# Patient Record
Sex: Female | Born: 1975 | Race: Black or African American | Hispanic: No | Marital: Married | Smoking: Never smoker
Health system: Southern US, Community
[De-identification: ages and names within clinical notes are randomized; demographics above are authoritative.]

## PROBLEM LIST (undated history)

## (undated) DIAGNOSIS — E079 Disorder of thyroid, unspecified: Secondary | ICD-10-CM

## (undated) DIAGNOSIS — I10 Essential (primary) hypertension: Secondary | ICD-10-CM

## (undated) HISTORY — PX: BREAST CYST EXCISION: SHX579

---

## 2008-08-20 ENCOUNTER — Emergency Department (HOSPITAL_COMMUNITY): Admission: EM | Admit: 2008-08-20 | Discharge: 2008-08-20 | Payer: Self-pay | Admitting: Emergency Medicine

## 2008-10-05 ENCOUNTER — Emergency Department (HOSPITAL_COMMUNITY): Admission: EM | Admit: 2008-10-05 | Discharge: 2008-10-05 | Payer: Self-pay | Admitting: Emergency Medicine

## 2008-11-09 ENCOUNTER — Encounter: Payer: Self-pay | Admitting: Obstetrics & Gynecology

## 2008-11-09 ENCOUNTER — Ambulatory Visit: Payer: Self-pay | Admitting: Obstetrics and Gynecology

## 2008-11-09 LAB — CONVERTED CEMR LAB
Chlamydia, DNA Probe: NEGATIVE
GC Probe Amp, Genital: NEGATIVE

## 2008-11-13 ENCOUNTER — Ambulatory Visit (HOSPITAL_COMMUNITY): Admission: RE | Admit: 2008-11-13 | Discharge: 2008-11-13 | Payer: Self-pay | Admitting: Obstetrics & Gynecology

## 2008-11-24 ENCOUNTER — Other Ambulatory Visit: Admission: RE | Admit: 2008-11-24 | Discharge: 2008-11-24 | Payer: Self-pay | Admitting: Obstetrics and Gynecology

## 2008-11-24 ENCOUNTER — Ambulatory Visit: Payer: Self-pay | Admitting: Family Medicine

## 2008-11-24 LAB — CONVERTED CEMR LAB
Ferritin: 16 ng/mL (ref 10–291)
Hgb A2 Quant: 2.1 % — ABNORMAL LOW (ref 2.2–3.2)
Hgb S Quant: 0 % (ref 0.0–0.0)
Vitamin B-12: 487 pg/mL (ref 211–911)

## 2008-12-13 ENCOUNTER — Ambulatory Visit: Payer: Self-pay | Admitting: Obstetrics and Gynecology

## 2008-12-13 LAB — CONVERTED CEMR LAB
Free T4: 0.53 ng/dL — ABNORMAL LOW (ref 0.80–1.80)
T3, Free: 2.3 pg/mL (ref 2.3–4.2)

## 2009-10-10 ENCOUNTER — Encounter (INDEPENDENT_AMBULATORY_CARE_PROVIDER_SITE_OTHER): Payer: Self-pay | Admitting: Family Medicine

## 2009-10-10 ENCOUNTER — Ambulatory Visit: Payer: Self-pay | Admitting: Internal Medicine

## 2009-10-10 LAB — CONVERTED CEMR LAB
ALT: <8 units/L (ref 0–35)
Basophils Relative: 1 % (ref 0–1)
Glucose, Bld: 87 mg/dL (ref 70–99)
Lymphs Abs: 1.2 10*3/uL (ref 0.7–4.0)
MCV: 74 fL — ABNORMAL LOW (ref 78.0–100.0)
Monocytes Absolute: 0.4 10*3/uL (ref 0.1–1.0)
Platelets: 452 10*3/uL — ABNORMAL HIGH (ref 150–400)
RDW: 17.3 % — ABNORMAL HIGH (ref 11.5–15.5)
Total Bilirubin: 0.4 mg/dL (ref 0.3–1.2)
Vit D, 25-Hydroxy: <10 ng/mL — ABNORMAL LOW (ref 30–89)
WBC: 4.5 10*3/uL (ref 4.0–10.5)

## 2009-11-07 ENCOUNTER — Encounter (INDEPENDENT_AMBULATORY_CARE_PROVIDER_SITE_OTHER): Payer: Self-pay | Admitting: Family Medicine

## 2009-11-07 ENCOUNTER — Ambulatory Visit: Payer: Self-pay | Admitting: Internal Medicine

## 2009-11-07 LAB — CONVERTED CEMR LAB: Free T4: 0.69 ng/dL — ABNORMAL LOW (ref 0.80–1.80)

## 2010-01-02 ENCOUNTER — Ambulatory Visit: Payer: Self-pay | Admitting: Internal Medicine

## 2010-01-02 ENCOUNTER — Encounter (INDEPENDENT_AMBULATORY_CARE_PROVIDER_SITE_OTHER): Payer: Self-pay | Admitting: Family Medicine

## 2010-01-02 LAB — CONVERTED CEMR LAB
Basophils Absolute: 0.1 10*3/uL (ref 0.0–0.1)
Basophils Relative: 1 % (ref 0–1)
Eosinophils Relative: 6 % — ABNORMAL HIGH (ref 0–5)
HCT: 30.7 % — ABNORMAL LOW (ref 36.0–46.0)
HDL: 36 mg/dL — ABNORMAL LOW (ref >39)
Hemoglobin: 8.8 g/dL — ABNORMAL LOW (ref 12.0–15.0)
MCHC: 28.7 g/dL — ABNORMAL LOW (ref 30.0–36.0)
MCV: 71.6 fL — ABNORMAL LOW (ref 78.0–100.0)
Monocytes Absolute: 0.5 10*3/uL (ref 0.1–1.0)
Monocytes Relative: 10 % (ref 3–12)
Neutro Abs: 2.5 10*3/uL (ref 1.7–7.7)
Neutrophils Relative %: 52 % (ref 43–77)
RDW: 18.5 % — ABNORMAL HIGH (ref 11.5–15.5)
Total CHOL/HDL Ratio: 4.4
Triglycerides: 67 mg/dL (ref <150)
VLDL: 13 mg/dL (ref 0–40)
WBC: 4.9 10*3/uL (ref 4.0–10.5)

## 2010-01-16 ENCOUNTER — Ambulatory Visit: Payer: Self-pay | Admitting: Internal Medicine

## 2010-01-24 ENCOUNTER — Ambulatory Visit (HOSPITAL_COMMUNITY): Admission: RE | Admit: 2010-01-24 | Discharge: 2010-01-24 | Payer: Self-pay | Admitting: Family Medicine

## 2010-02-20 ENCOUNTER — Ambulatory Visit (HOSPITAL_COMMUNITY): Admission: RE | Admit: 2010-02-20 | Discharge: 2010-02-20 | Payer: Self-pay | Admitting: Family Medicine

## 2010-08-09 IMAGING — US US TRANSVAGINAL NON-OB
1 series · 14 of 25 positions shown · non-contrast
Comparison: None

CLINICAL DATA: Pelvic pain, menorrhagia

TRANSABDOMINAL AND TRANSVAGINAL ULTRASOUND OF PELVIS
TECHNIQUE: Both transabdominal and transvaginal ultrasound
examinations of the pelvis were performed including evaluation of
the uterus, ovaries, adnexal regions, and pelvic cul-de-sac.

[Series 1: us transvaginal non-ob · 0.27mm/px · 14 of 40 slices shown]
[im 1/40]
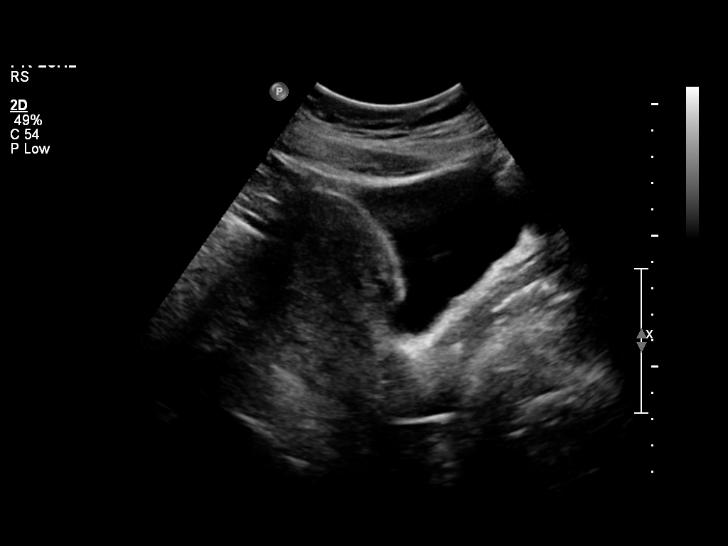
[im 4/40]
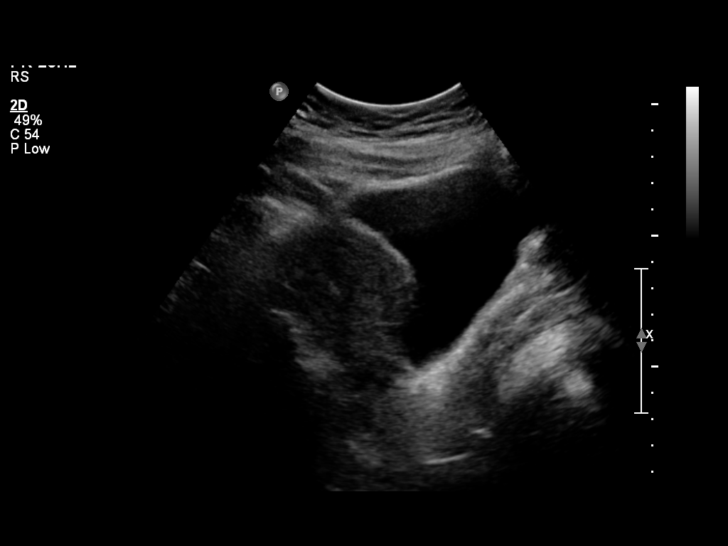
[im 7/40]
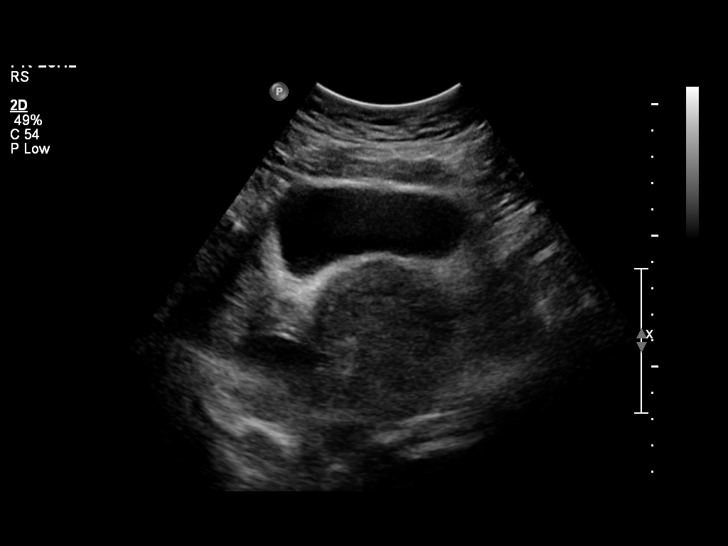
[im 10/40]
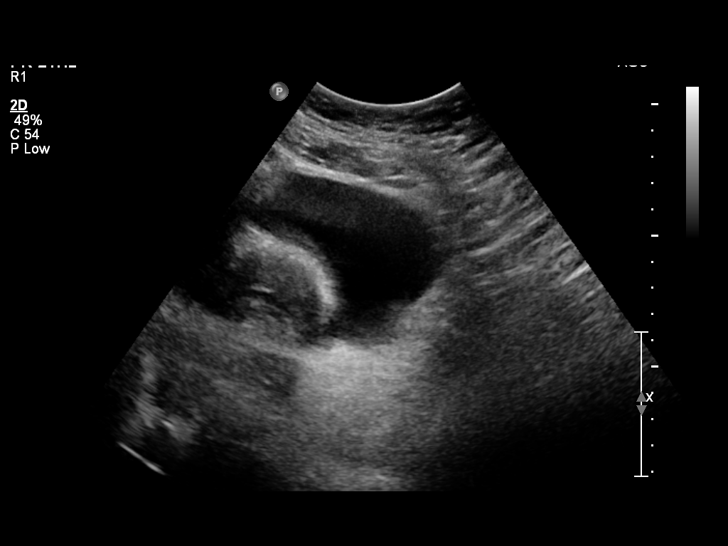
[im 14/40]
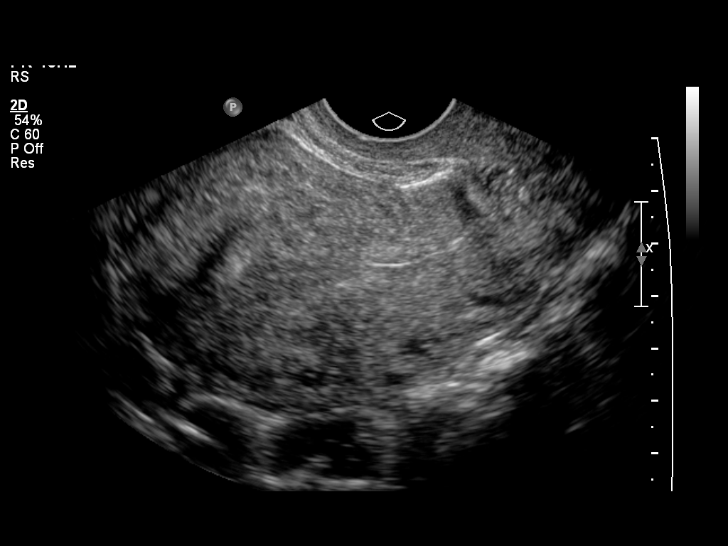
[im 15/40]
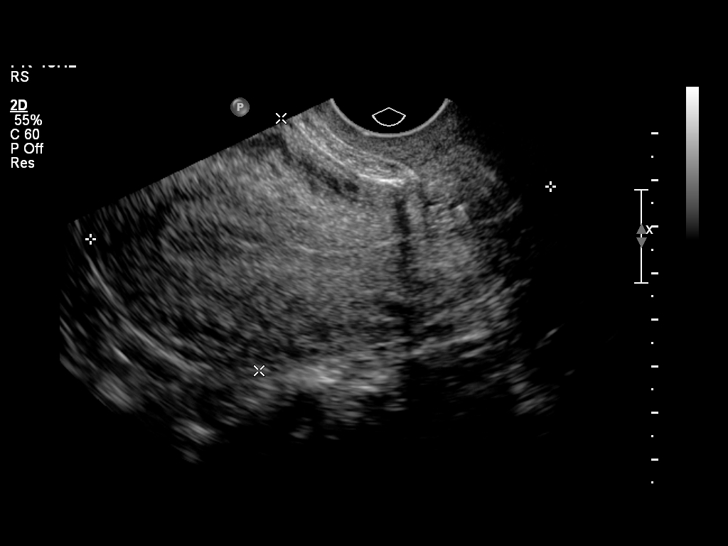
[im 18/40]
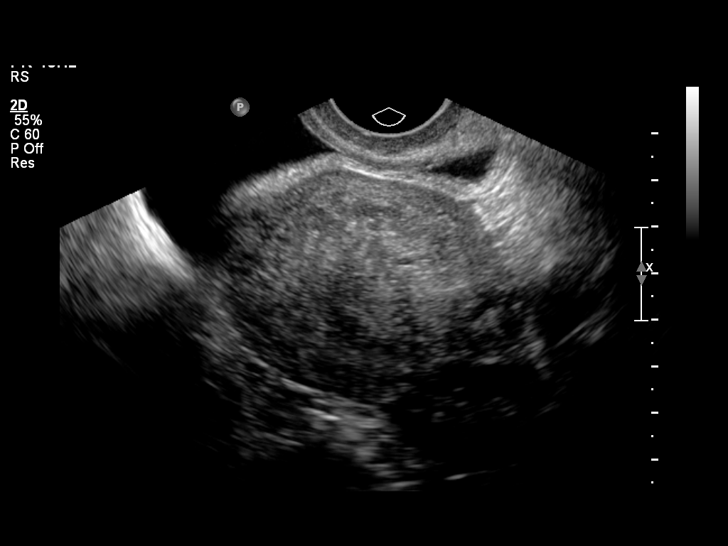
[im 22/40]
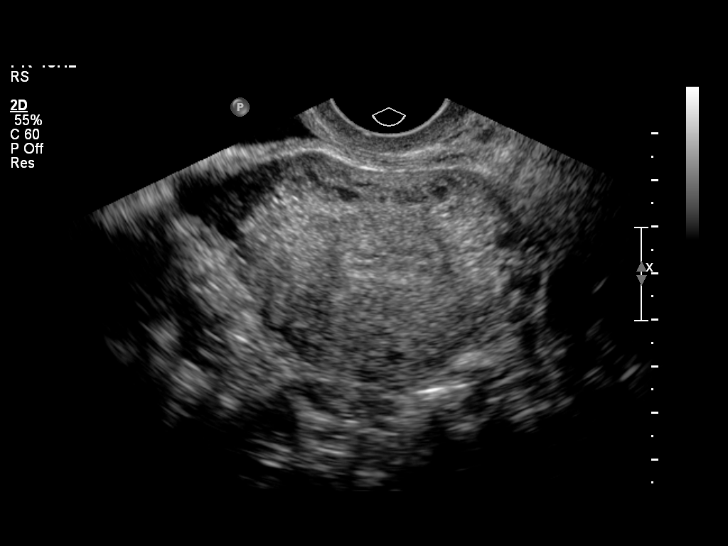
[im 25/40]
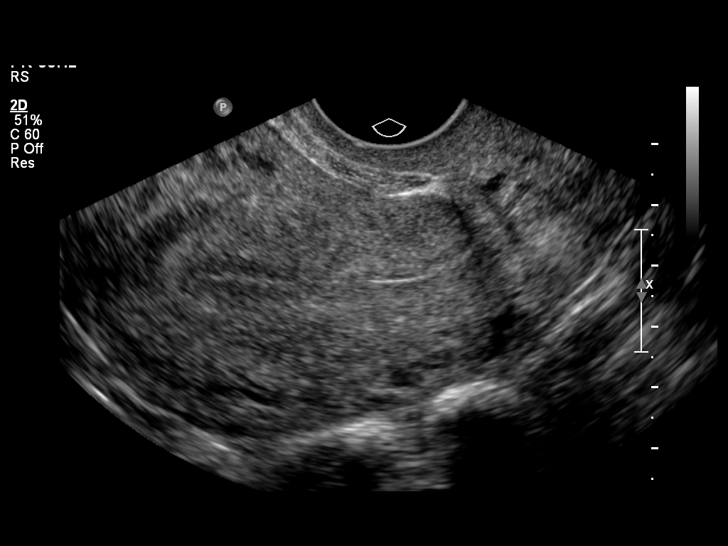
[im 27/40]
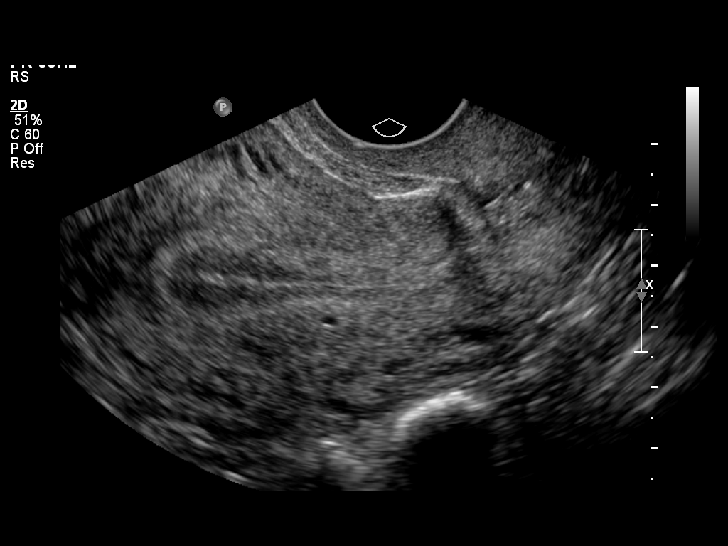
[im 30/40]
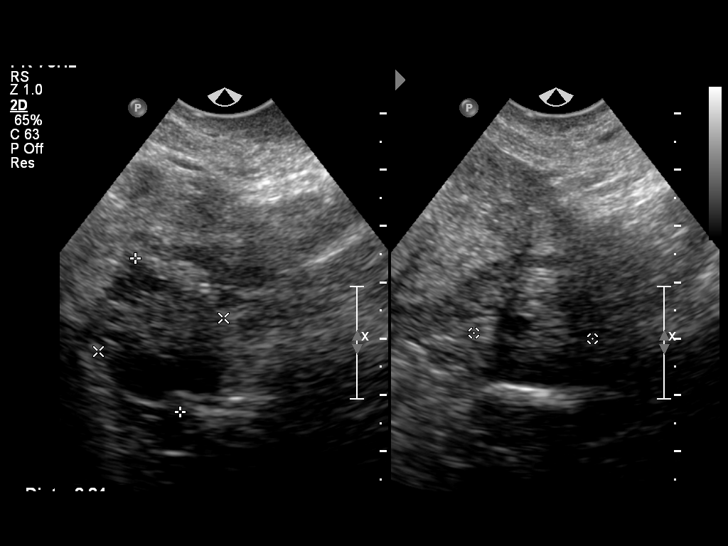
[im 33/40]
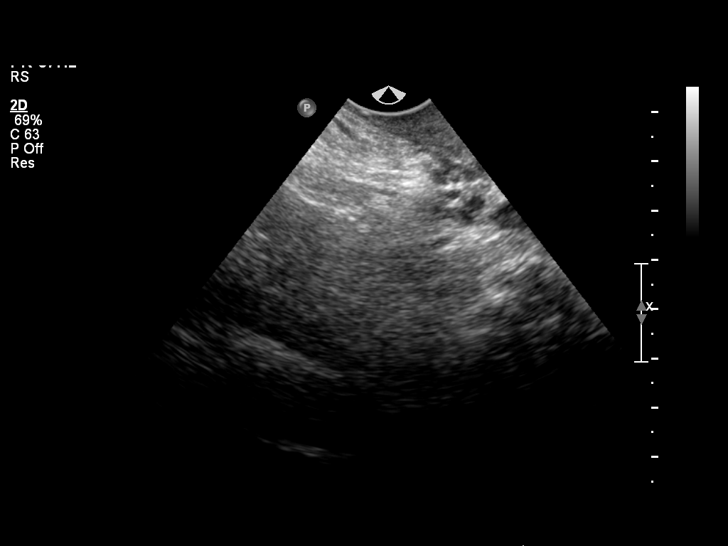
[im 36/40]
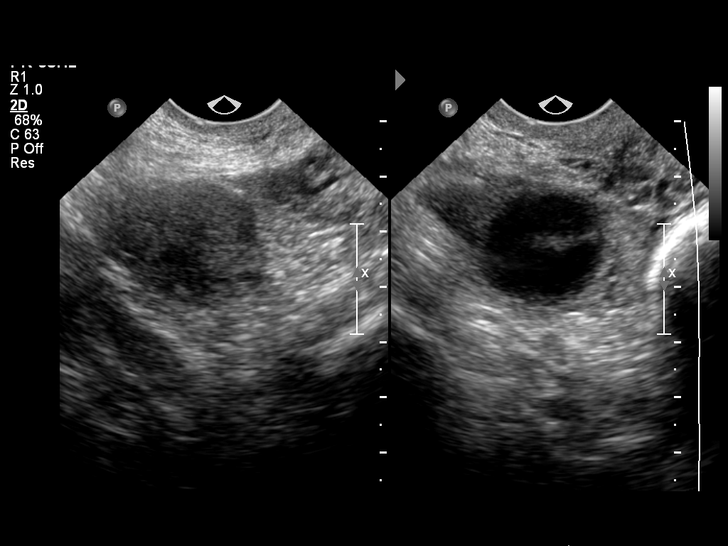
[im 40/40]
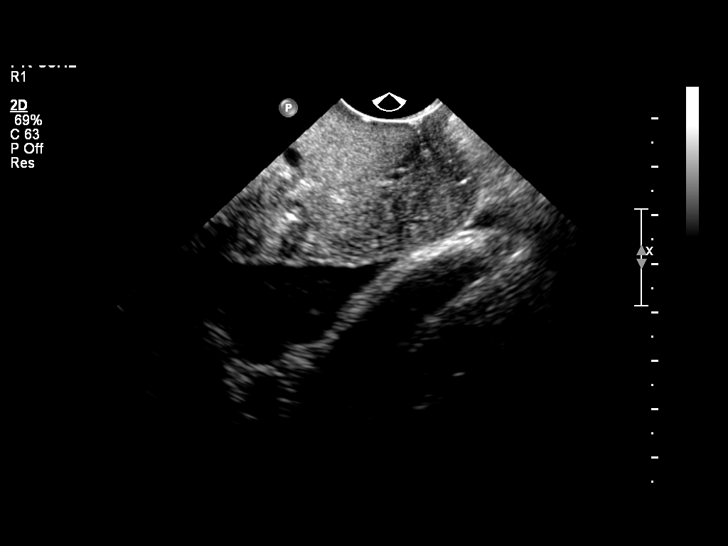

[14 of 25 positions shown; findings below may reference images not displayed]

FINDINGS: Uterus:  Anteverted, anteflexed, normal in appearance.  C-section
defect incidentally noted.

Endometrium:  Trilaminar, 1.4 cm

Right Ovary:  3.3 x 3.1 x 3.2 cm simple appearing cyst incidentally
noted, likely physiologic.  Otherwise normal in appearance.

Left Ovary:  Normal

Other Findings:  Small amount of free fluid incidentally noted in
the cul-de-sac.
IMPRESSION: No acute abnormality.

## 2010-10-08 LAB — POCT PREGNANCY, URINE: Preg Test, Ur: NEGATIVE

## 2010-10-08 LAB — POCT URINALYSIS DIP (DEVICE)
Bilirubin Urine: NEGATIVE
Nitrite: NEGATIVE
Protein, ur: NEGATIVE mg/dL
pH: 5 (ref 5.0–8.0)

## 2010-10-09 LAB — URINE MICROSCOPIC-ADD ON

## 2010-10-09 LAB — COMPREHENSIVE METABOLIC PANEL
ALT: 12 U/L (ref 0–35)
AST: 20 U/L (ref 0–37)
Albumin: 3.5 g/dL (ref 3.5–5.2)
Alkaline Phosphatase: 56 U/L (ref 39–117)
CO2: 23 mEq/L (ref 19–32)
Calcium: 8.1 mg/dL — ABNORMAL LOW (ref 8.4–10.5)
Potassium: 3.6 mEq/L (ref 3.5–5.1)
Sodium: 137 mEq/L (ref 135–145)
Total Bilirubin: 0.3 mg/dL (ref 0.3–1.2)
Total Protein: 6.7 g/dL (ref 6.0–8.3)

## 2010-10-09 LAB — WET PREP, GENITAL

## 2010-10-09 LAB — DIFFERENTIAL
Basophils Relative: 2 % — ABNORMAL HIGH (ref 0–1)
Eosinophils Absolute: 0.2 10*3/uL (ref 0.0–0.7)
Eosinophils Relative: 4 % (ref 0–5)
Lymphocytes Relative: 31 % (ref 12–46)
Monocytes Relative: 9 % (ref 3–12)
Neutro Abs: 2.6 10*3/uL (ref 1.7–7.7)

## 2010-10-09 LAB — URINALYSIS, ROUTINE W REFLEX MICROSCOPIC
Bilirubin Urine: NEGATIVE
Ketones, ur: NEGATIVE mg/dL

## 2010-10-09 LAB — CBC: HCT: 28.2 % — ABNORMAL LOW (ref 36.0–46.0)

## 2010-10-09 LAB — LIPASE, BLOOD: Lipase: 25 U/L (ref 11–59)

## 2010-11-12 NOTE — Group Therapy Note (Signed)
[name], [name]                ACCOUNT NO.:  [id number]   MEDICAL RECORD NO.:  [id number]          PATIENT TYPE:  WOC   LOCATION:  [facility]                   FACILITY:  [facility]   PHYSICIAN:  Tinnie Gens, MD        DATE OF BIRTH:  11-20-1975   DATE OF SERVICE:  11/24/2008                                  CLINIC NOTE   CHIEF COMPLAINT:  Abnormal uterine bleeding.   HISTORY OF PRESENT ILLNESS:  The patient is a 35 year old gravida 3,  para 3, who has a history of 2 prior cesarean sections.  She came in  with menorrhagia and marked anemia with hemoglobin of 8.8.  The patient  was seen by Dr. Adine Madura on May 13.  She underwent Pap smear, GC, and  Chlamydia testing which were negative.  Her Pap was also normal except  she has Trich on it.  She had previously been treated in April, but her  partner had not been treated for this disease and saw him suspecting  reinfection.  Ultrasound was ordered and is reviewed, shows normal  uterus, endometrium, trilaminar 1.4 cm, right ovary simple appearing  cyst, left ovary normal.  The patient does report a history of anemia.  She states she has been anemic her whole life.  She does not remember  any significant workup or ever being told she had sickle cell disease or  trait, and she has a heavy cycle every other month.  However, she  reports bleeding only for 3 days at a time.  It is unclear to me how  much of this is actually heavy bleeding which she does report 1-year  history of this.  She has been placed on slow iron, but I am unclear  that she has had a full anemia workup.  Additionally, her thyroid has  not been checked nor she had endometrial sampling.   On exam today, her vitals are as in the chart.  She is a well-developed,  well-nourished female, in no acute distress.  Moderately obese.  After  informed consent and negative EPT following procedures performed.   PROCEDURE:  Speculum was placed on the vagina.  Cervix is visualized,  cleaned with Betadine x1, grasped anteriorly with single-tooth  tenaculum.  A Pipelle was sounded to approximately 6 cm and is used to  sound the uterus at approximately 6 cm.  Endometrial sampling is  obtained without difficulty.  Moderate amount of tissue was obtained.  The patient tolerated the procedure well.   IMPRESSION:  Abnormal uterine bleeding with marked anemia.   PLAN:  1. Endometrial sampling today.  2. Check TSH.  3. Anemia workup with ferritin, B12 folate, and hemoglobin      electrophoresis  4. Treat Trich and have her partner treated as well.           ______________________________  Tinnie Gens, MD    TP/MEDQ  D:  11/24/2008  T:  11/24/2008  Job:  [id number]

## 2010-11-12 NOTE — Group Therapy Note (Signed)
[name], CLAGG NO.:  [id number]   MEDICAL RECORD NO.:  [id number]          PATIENT TYPE:  WOC   LOCATION:  [facility]                   FACILITY:  [facility]   PHYSICIAN:  Scheryl Darter, MD       DATE OF BIRTH:  Apr 08, 1976   DATE OF SERVICE:  11/09/2008                                  CLINIC NOTE   The patient comes today after being seen for abdominal pain at [facility] Emergency Room on October 05, 2008.  The patient is a 35 year old  black female gravida 3, para 3.  Last menstrual period Nov 01, 2008, who  had tubal ligation at the time of her last C-section which was 13 years  ago.  The patient says that she is noticing heavy periods about every  other month.  Her menses are regular and lasts about 4 days.  Some  times, she has some clots.  The patient has history of stomach ulcers,  although she has never had endoscopy.  She was treated with omeprazole  previously which she had run out of this.  When she was in emergency  room, she was given a few omeprazole tablets.  She was also treated for  Trichomonas with Flagyl.  She states that she was not informed of this  diagnosis.   PAST MEDICAL HISTORY:  Stomach ulcers.   SOCIAL HISTORY:  The patient is married and she denies alcohol or  tobacco.  States she has used drugs in the past.   FAMILY HISTORY:  Diabetes and hypertension.  Her last Pap smear was over  a year ago.   MEDICATIONS:  None.   REVIEW OF SYSTEMS:  The patient denies any abnormal discharge, urinary  symptoms, or bleeding currently.  She says she has periumbilical pain  and some reflux symptoms.   PHYSICAL EXAMINATION:  No acute distress.  Weight is 216 pounds, height  5 feet 3, temperature 97.4, pulse 64, BP 124/87.  Abdomen is moderately  obese, soft, nontender, no mass.  Pelvic exam; external genitalia,  vagina, and cervix appeared normal.  No abnormal discharge.  Pap was  obtained.  Uterus appears to be normal size.  No adnexal masses  or  tenderness.   IMPRESSION:  1. Abdominal pain which may be related to previous diagnosis of      ulcers.  2. Subjective menorrhagia.  The patient's hemoglobin was 8.8 in the      emergency room and this anemia may be secondary to menorrhagia.   PLAN:  I have ordered a pelvic ultrasound.  She will return in 2 weeks  for the result.  Recommend she arrange care with [facility] doctor  as she may need and a gastroenterologist for evaluation of her  gastrointestinal symptoms.  I gave her a prescription for Slow Fe one  p.o. daily and omeprazole 40 mg p.o. daily.      Scheryl Darter, MD     JA/MEDQ  D:  11/09/2008  T:  11/10/2008  Job:  [id number]

## 2010-11-12 NOTE — Group Therapy Note (Signed)
NAMELYRIKA, SOUDERS NO.:  [id number]   MEDICAL RECORD NO.:  [id number]          PATIENT TYPE:  WOC   LOCATION:  [facility]                   FACILITY:  [facility]   PHYSICIAN:  Argentina Donovan, MD        DATE OF BIRTH:  [birth year]   DATE OF SERVICE:  12/13/2008                                  CLINIC NOTE   CHIEF COMPLAINT:  Results of endometrial biopsy and labs followup.   HISTORY OF PRESENT ILLNESS:  This is a 35 year old G3, P3 with history  of menorrhagia and marked anemia who is presenting for followup of some  blood work that was obtained to rule out other causes of the anemia.  Previous lab work reviewed and showing TSH slightly elevated at 5.28,  vitamin B12 and folate normal, hemoglobin electrophoresis within normal  limits and ferritin low at 16.  Patient also had endometrial biopsy  which pathology returned benign.  As far as the thyroid abnormality she  does endorse increased weight recently with five pounds since the last  clinic visit as well as constipation and heat intolerance.  She denies  any diarrhea or other bowel changes or any skin or hair changes.  As far  as her menorrhagia she states that every other month her cycle is heavy  with heavy bleeding for 3 days.  She states she has to use tampons and  pads and is changing every hour throughout the whole day.  At night it  gets better.  On alternating months she has normal cycles.  She states  she has had this going on for the last year.  She does not have a  current PCP as she has recently moved to [place], but states she is  in the process of finding one.  As far as her anemia, her hemoglobin on  last check was 8.8 with an MCV of 68 and a RDW of 18.8%.  The patient  states that she has chronically been anemic and has been on Slow Fe  therapy daily for a couple of months now.   ALLERGIES:  None.   MEDICATIONS:  1. She is on Slow Fe 1 p.o. daily.  2. Prilosec 1 p.o. daily.   PREVIOUS  SURGERIES:  C-section x2.   SOCIAL HISTORY:  Patient denies tobacco use.   PHYSICAL EXAMINATION:  Weight 222.4 pounds.  GENERAL:  In no acute distress.  Pleasant African American female,  obese.  HEENT:  __________ .  NECK:  No thyromegaly appreciated.  HEART:  Normal S1, S2.  No murmurs, rubs or gallops.  EXTREMITIES:  No edema.   ASSESSMENT/PLAN:  This is a 35 year old pleasant African American female  who presents for followup of abnormal bleeding and endometrial biopsy.  1. Menorrhagia.  Results of laboratory work and biopsy were reviewed      with patient.  At this time next step will be further thyroid      function studies including T3 and free T4.  We will obtain      laboratory work today.  Also discussed hormonal therapy to see if  we can help regularize her cycle.  Patient would like to try this.      Started on oral contraceptives, Loestrin 24.  Advised her to return      in 2-3 months for followup to see how her cycle is doing.  2. Anemia.  The patient was encouraged to go up on her iron to twice      daily given her ferritin was 16.     ______________________________  Armen Pickup, Resident    ______________________________  Argentina Donovan, MD    JG/MEDQ  D:  12/13/2008  T:  12/13/2008  Job:  [id number]

## 2011-01-17 ENCOUNTER — Emergency Department (HOSPITAL_COMMUNITY): Payer: Self-pay

## 2011-01-17 ENCOUNTER — Emergency Department (HOSPITAL_COMMUNITY)
Admission: EM | Admit: 2011-01-17 | Discharge: 2011-01-17 | Disposition: A | Discharge status: Home / Self Care | Payer: Self-pay | Attending: Emergency Medicine | Admitting: Emergency Medicine

## 2011-01-17 DIAGNOSIS — J069 Acute upper respiratory infection, unspecified: Secondary | ICD-10-CM | POA: Insufficient documentation

## 2011-01-17 DIAGNOSIS — H9209 Otalgia, unspecified ear: Secondary | ICD-10-CM | POA: Insufficient documentation

## 2011-01-17 DIAGNOSIS — R079 Chest pain, unspecified: Secondary | ICD-10-CM | POA: Insufficient documentation

## 2011-01-17 DIAGNOSIS — R059 Cough, unspecified: Secondary | ICD-10-CM | POA: Insufficient documentation

## 2011-01-17 DIAGNOSIS — R05 Cough: Secondary | ICD-10-CM | POA: Insufficient documentation

## 2011-07-21 ENCOUNTER — Encounter (HOSPITAL_COMMUNITY): Payer: Self-pay

## 2011-07-21 ENCOUNTER — Emergency Department (HOSPITAL_COMMUNITY)
Admission: EM | Admit: 2011-07-21 | Discharge: 2011-07-21 | Disposition: A | Discharge status: Home / Self Care | Payer: Self-pay | Attending: Emergency Medicine | Admitting: Emergency Medicine

## 2011-07-21 ENCOUNTER — Emergency Department (HOSPITAL_COMMUNITY): Payer: Self-pay

## 2011-07-21 DIAGNOSIS — J40 Bronchitis, not specified as acute or chronic: Secondary | ICD-10-CM | POA: Insufficient documentation

## 2011-07-21 DIAGNOSIS — R079 Chest pain, unspecified: Secondary | ICD-10-CM | POA: Insufficient documentation

## 2011-07-21 DIAGNOSIS — J3489 Other specified disorders of nose and nasal sinuses: Secondary | ICD-10-CM | POA: Insufficient documentation

## 2011-07-21 DIAGNOSIS — R05 Cough: Secondary | ICD-10-CM | POA: Insufficient documentation

## 2011-07-21 DIAGNOSIS — J329 Chronic sinusitis, unspecified: Secondary | ICD-10-CM | POA: Insufficient documentation

## 2011-07-21 DIAGNOSIS — R059 Cough, unspecified: Secondary | ICD-10-CM | POA: Insufficient documentation

## 2011-07-21 DIAGNOSIS — R51 Headache: Secondary | ICD-10-CM | POA: Insufficient documentation

## 2011-07-21 MED ORDER — AMOXICILLIN 500 MG PO CAPS: 500.0000 mg | ORAL_CAPSULE | Freq: Three times a day (TID) | ORAL | Status: AC

## 2011-07-21 NOTE — ED Notes (Signed)
Complains of tightness in chest for two days, sent home from work today with cough and congestion.

## 2011-07-21 NOTE — ED Provider Notes (Addendum)
History     CSN: [id number]  Arrival date & time 07/21/11  1207   First MD Initiated Contact with Patient 07/21/11 1316      Chief Complaint  Patient presents with  . Nasal Congestion  . Chest Pain    (Consider location/radiation/quality/duration/timing/severity/associated sxs/prior treatment) Patient is a 36 y.o. female presenting with cough. The history is provided by the patient (Patient complains of a cough with sputum production yellow. Also she has a sinus congestion and drainage. This is going on for about 3 more days.). No language interpreter was used.  Cough This is a new problem. The current episode started more than 2 days ago. The problem occurs constantly. The problem has not changed since onset.The cough is productive of sputum. There has been no fever. Associated symptoms include chest pain, ear congestion and headaches. Pertinent negatives include no chills, no sweats and no weight loss. She has tried nothing for the symptoms. The treatment provided mild relief. Risk factors: none. She is not a smoker. Her past medical history does not include bronchitis or pneumonia.    History reviewed. No pertinent past medical history.  History reviewed. No pertinent past surgical history.  History reviewed. No pertinent family history.  History  Substance Use Topics  . Smoking status: Never Smoker   . Smokeless tobacco: Not on file  . Alcohol Use: No    OB History    Grav Para Term Preterm Abortions TAB SAB Ect Mult Living                  Review of Systems  Constitutional: Negative for chills, weight loss and fatigue.  HENT: Negative for congestion, sinus pressure and ear discharge.   Eyes: Negative for discharge.  Respiratory: Positive for cough.   Cardiovascular: Positive for chest pain.  Gastrointestinal: Negative for abdominal pain and diarrhea.  Genitourinary: Negative for frequency and hematuria.  Musculoskeletal: Negative for back pain.  Skin: Negative  for rash.  Neurological: Positive for headaches. Negative for seizures.  Hematological: Negative.   Psychiatric/Behavioral: Negative for hallucinations.    Allergies  Review of patient's allergies indicates no known allergies.  Home Medications   Current Outpatient Rx  Name Route Sig Dispense Refill  . ASPIRIN EFFERVESCENT 325 MG PO TBEF Oral Take 325 mg by mouth every 6 (six) hours as needed. For cold    . AMOXICILLIN 500 MG PO CAPS Oral Take 1 capsule (500 mg total) by mouth 3 (three) times daily. 21 capsule 0    BP 114/84  Pulse 76  Temp(Src) 98.2 F (36.8 C) (Oral)  Resp 16  SpO2 95%  LMP 07/18/2011  Physical Exam  Constitutional: She is oriented to person, place, and time. She appears well-developed.  HENT:  Head: Normocephalic and atraumatic.  Eyes: Conjunctivae and EOM are normal. No scleral icterus.  Neck: Neck supple. No thyromegaly present.  Cardiovascular: Normal rate and regular rhythm.  Exam reveals no gallop and no friction rub.   No murmur heard. Pulmonary/Chest: No stridor. She has no wheezes. She has no rales. She exhibits no tenderness.  Abdominal: She exhibits no distension. There is no tenderness. There is no rebound.  Musculoskeletal: Normal range of motion. She exhibits no edema.  Lymphadenopathy:    She has no cervical adenopathy.  Neurological: She is oriented to person, place, and time. Coordination normal.  Skin: No rash noted. No erythema.  Psychiatric: She has a normal mood and affect. Her behavior is normal.    ED Course  Procedures (including critical care time)  Labs Reviewed - No data to display Dg Chest 2 View  07/21/2011  *RADIOLOGY REPORT*  Clinical Data: Shortness of breath, cough  CHEST - 2 VIEW  Comparison: 01/17/2011  Findings: Cardiomediastinal silhouette is stable.  No acute infiltrate or pleural effusion.  No pulmonary edema.  Bony thorax is stable.  Post cholecystectomy surgical clips are noted in the right upper abdomen.   IMPRESSION: No active disease.  No significant change.  Original Report Authenticated By: Natasha Mead, M.D.     1. Bronchitis   2. Sinusitis       MDM          Benny Lennert, MD 07/21/11 1556  Benny Lennert, MD 07/21/11 [phone]

## 2011-07-21 NOTE — ED Notes (Signed)
Pt waiting for a disposition.   

## 2012-01-21 ENCOUNTER — Emergency Department (HOSPITAL_COMMUNITY)
Admission: EM | Admit: 2012-01-21 | Discharge: 2012-01-21 | Disposition: A | Discharge status: Home / Self Care | Payer: Self-pay | Attending: Emergency Medicine | Admitting: Emergency Medicine

## 2012-01-21 ENCOUNTER — Encounter (HOSPITAL_COMMUNITY): Payer: Self-pay | Admitting: *Deleted

## 2012-01-21 DIAGNOSIS — M461 Sacroiliitis, not elsewhere classified: Secondary | ICD-10-CM

## 2012-01-21 DIAGNOSIS — M533 Sacrococcygeal disorders, not elsewhere classified: Secondary | ICD-10-CM | POA: Insufficient documentation

## 2012-01-21 LAB — URINALYSIS, ROUTINE W REFLEX MICROSCOPIC
Bilirubin Urine: NEGATIVE
Ketones, ur: NEGATIVE mg/dL
Nitrite: NEGATIVE
Specific Gravity, Urine: 1.016 (ref 1.005–1.030)
Urobilinogen, UA: 1 mg/dL (ref 0.0–1.0)
pH: 6 (ref 5.0–8.0)

## 2012-01-21 LAB — URINE MICROSCOPIC-ADD ON

## 2012-01-21 MED ORDER — DIAZEPAM 5 MG PO TABS: 5.0000 mg | ORAL_TABLET | Freq: Two times a day (BID) | ORAL | Status: AC

## 2012-01-21 MED ORDER — PREDNISONE 20 MG PO TABS: 40.0000 mg | ORAL_TABLET | Freq: Every day | ORAL | Status: AC

## 2012-01-21 NOTE — ED Provider Notes (Signed)
Medical screening examination/treatment/procedure(s) were performed by non-physician practitioner and as supervising physician I was immediately available for consultation/collaboration.   Jazzman Loughmiller W. Evann Koelzer, MD 01/21/12 1956 

## 2012-01-21 NOTE — ED Provider Notes (Signed)
History     CSN: [id number]  Arrival date & time 01/21/12  1010   First MD Initiated Contact with Patient 01/21/12 1056      Chief Complaint  Patient presents with  . Back Pain    (Consider location/radiation/quality/duration/timing/severity/associated sxs/prior treatment) HPI Comments: Patient reports that she has had intermittent left sided back pain for the past month.  Pain became worse yesterday.  No acute injury or trauma.  No recent strenuous activity.  Pain radiates into the left buttock.  She reports that she has never had pain like this before.  She had been taking Aleve for the pain, which did help initially.  However, she reports that yesterday the pain did not improve with Aleve.  Pain worse when she goes up steps and also worse when she bends a certain way.    Patient is a 36 y.o. female presenting with back pain. The history is provided by the patient.  Back Pain  Pertinent negatives include no fever, no numbness, no bowel incontinence, no perianal numbness, no bladder incontinence, no dysuria, no paresthesias, no paresis, no tingling and no weakness.    History reviewed. No pertinent past medical history.  History reviewed. No pertinent past surgical history.  History reviewed. No pertinent family history.  History  Substance Use Topics  . Smoking status: Never Smoker   . Smokeless tobacco: Not on file  . Alcohol Use: No    OB History    Grav Para Term Preterm Abortions TAB SAB Ect Mult Living                  Review of Systems  Constitutional: Negative for fever and chills.  Gastrointestinal: Negative for nausea, vomiting and bowel incontinence.  Genitourinary: Negative for bladder incontinence and dysuria.  Musculoskeletal: Positive for back pain. Negative for joint swelling and gait problem.  Skin: Negative for color change.  Neurological: Negative for tingling, weakness, numbness and paresthesias.    Allergies  Review of patient's allergies  indicates no known allergies.  Home Medications   Current Outpatient Rx  Name Route Sig Dispense Refill  . NAPROXEN SODIUM 220 MG PO TABS Oral Take 220 mg by mouth 3 (three) times daily as needed. For pain      BP 124/90  Pulse 73  Temp 97.7 F (36.5 C) (Oral)  Resp 18  SpO2 100%  Physical Exam  Nursing note and vitals reviewed. Constitutional: She appears well-developed and well-nourished.  HENT:  Head: Normocephalic and atraumatic.  Mouth/Throat: Oropharynx is clear and moist.  Neck: Normal range of motion. Neck supple.  Cardiovascular: Normal rate, regular rhythm and normal heart sounds.   Pulmonary/Chest: Effort normal and breath sounds normal.  Musculoskeletal: Normal range of motion.       Left hip: She exhibits normal range of motion, no swelling and no deformity.       Back:  Neurological: She is alert. She has normal strength. No sensory deficit. Gait normal.  Reflex Scores:      Patellar reflexes are 2+ on the right side and 2+ on the left side.      Achilles reflexes are 2+ on the right side and 2+ on the left side. Skin: Skin is warm and dry.  Psychiatric: She has a normal mood and affect.    ED Course  Procedures (including critical care time)   Labs Reviewed  URINALYSIS, ROUTINE W REFLEX MICROSCOPIC   No results found.   No diagnosis found.    MDM  Patient with tenderness to palpation of the SI joint.  She denies numbness, tingling, or loss of bowel or bladder incontinence.  Patient given short course of Prednisone and instructed to follow up with PCP.          Pascal Lux Snyder, PA-C 01/21/12 1254

## 2012-01-21 NOTE — ED Notes (Signed)
To ED for eval of left lower back pain for the past month. Pain increases with any movement. Denies pain with urination. States she has been taking Aleve for the pain with relief, but the relief has stopped working recently. Denies fevers. Pain radiates to left buttocks.

## 2012-01-21 NOTE — ED Notes (Signed)
Pt d/c home in  [place]. Pt voiced understanding of d/c instructions and follow up care. Pt instructed not to drive after taking valium.

## 2013-09-04 ENCOUNTER — Encounter (HOSPITAL_COMMUNITY): Payer: Self-pay | Admitting: Emergency Medicine

## 2013-09-04 ENCOUNTER — Emergency Department (HOSPITAL_COMMUNITY)
Admission: EM | Admit: 2013-09-04 | Discharge: 2013-09-04 | Disposition: A | Discharge status: Home / Self Care | Payer: 59 | Attending: Emergency Medicine | Admitting: Emergency Medicine

## 2013-09-04 DIAGNOSIS — M5430 Sciatica, unspecified side: Secondary | ICD-10-CM

## 2013-09-04 DIAGNOSIS — M545 Low back pain, unspecified: Secondary | ICD-10-CM

## 2013-09-04 MED ORDER — NAPROXEN 500 MG PO TABS: 500.0000 mg | ORAL_TABLET | Freq: Two times a day (BID) | ORAL | Status: DC

## 2013-09-04 MED ORDER — METHOCARBAMOL 500 MG PO TABS: 500.0000 mg | ORAL_TABLET | Freq: Two times a day (BID) | ORAL | Status: DC | PRN

## 2013-09-04 MED ORDER — DEXAMETHASONE SODIUM PHOSPHATE 10 MG/ML IJ SOLN
10.0000 mg | Freq: Once | INTRAMUSCULAR | Status: AC
  Administered 2013-09-04: 10 mg via INTRAMUSCULAR
  Filled 2013-09-04: qty 1

## 2013-09-04 MED ORDER — KETOROLAC TROMETHAMINE 60 MG/2ML IM SOLN
60.0000 mg | Freq: Once | INTRAMUSCULAR | Status: AC
  Administered 2013-09-04: 60 mg via INTRAMUSCULAR
  Filled 2013-09-04: qty 2

## 2013-09-04 NOTE — ED Provider Notes (Signed)
CSN: [id number]     Arrival date & time 09/04/13  1646 History  This chart was scribed for non-physician practitioner, Rudene Anda, PA-C working with Hurman Horn, MD by Greggory Stallion, ED scribe. This patient was seen in room TR08C/TR08C and the patient's care was started at 5:19 PM.   Chief Complaint  Patient presents with  . Hip Pain   The history is provided by the patient. No language interpreter was used.   HPI Comments: Carrie [name] is a 38 y.o. female who presents to the Emergency Department complaining of worsening, intermittent, sharp pain that radiates into her hip and down the back of her left leg that started 2 weeks ago. Pt also admits to mild, intermittent swelling in her left leg. Rates the pain 8/10. Patient also admits to Left low back "tightness" Denies recent injury, fall or increase in activity. Certain movements worsen the pain. Standing up for long periods of time cause intermittent numbness in her left foot. Pt has taken aleve with no relief. The patient has no upper back or neck pain, no fever, no hx of cancer, IVDU, recent spinal procedures, weakness of the lower extremities and no urinary complaints including no retention or incontinence Denies any significant PMH.   History reviewed. No pertinent past medical history. History reviewed. No pertinent past surgical history. No family history on file. History  Substance Use Topics  . Smoking status: Never Smoker   . Smokeless tobacco: Not on file  . Alcohol Use: No   OB History   Grav Para Term Preterm Abortions TAB SAB Ect Mult Living                 Review of Systems  Constitutional: Negative for fever and chills.  Genitourinary:       Negative for bowel or bladder incontinence.   Neurological: Negative for numbness.  All other systems reviewed and are negative.   Allergies  Review of patient's allergies indicates no known allergies.  Home Medications   Current Outpatient Rx  Name  Route   Sig  Dispense  Refill  . naproxen sodium (ALEVE) 220 MG tablet   Oral   Take 440 mg by mouth daily as needed (for pain).          . methocarbamol (ROBAXIN) 500 MG tablet   Oral   Take 1 tablet (500 mg total) by mouth 2 (two) times daily as needed for muscle spasms.   20 tablet   0   . naproxen (NAPROSYN) 500 MG tablet   Oral   Take 1 tablet (500 mg total) by mouth 2 (two) times daily with a meal.   30 tablet   0    BP 139/73  Pulse 90  Temp(Src) 98.6 F (37 C) (Oral)  Resp 18  SpO2 99%  LMP 09/01/2013  Physical Exam  Nursing note and vitals reviewed. Constitutional: She is oriented to person, place, and time. She appears well-developed and well-nourished. No distress.  HENT:  Head: Normocephalic and atraumatic.  Eyes: Conjunctivae are normal.  Neck: No JVD present. No tracheal deviation present.  Cardiovascular: Normal rate and regular rhythm.  Exam reveals no gallop and no friction rub.   No murmur heard. Pulmonary/Chest: Effort normal. No respiratory distress. She has no wheezes. She has no rhonchi. She has no rales.  Musculoskeletal: Normal range of motion. She exhibits no edema.  No midline spinal tenderness. Left lower lumbar paraspinal tightness as compared to RIGHT. Pain illicited with palpation  at the left sciatic notch. No tenderness at the trochanteric bursa.  Positive straight leg raise on the left. Negative on the right.   Good ROM of hip. Patient bears weight and ambulates in room without assistance.   No warmth or swelling over LEFT hip.   Neurological: She is alert and oriented to person, place, and time.  Skin: Skin is warm and dry. She is not diaphoretic.  Psychiatric: She has a normal mood and affect. Her behavior is normal.    ED Course  Procedures (including critical care time)  DIAGNOSTIC STUDIES: Oxygen Saturation is 99% on RA, normal by my interpretation.    COORDINATION OF CARE: 5:29 PM-Discussed treatment plan which includes an  anti-inflammatory with pt at bedside and pt agreed to plan. Advised pt to follow up with her PCP.  Labs Review Labs Reviewed - No data to display Imaging Review No results found.   EKG Interpretation None      MDM   Final diagnoses:  Sciatica  LBP (low back pain)    Patient afebrile.  No hx of trauma/injury. Given hx and exam doubt, septic hip, doubt hemarthrosis, doubt Gout.   Plan to have patient [facility]. Advised to return if symptoms should worsen, she develops urinary incontinence retention, leg numbess or weakness.   Meds given in ED:  Medications  ketorolac (TORADOL) injection 60 mg (60 mg Intramuscular Given 09/04/13 1739)  dexamethasone (DECADRON) injection 10 mg (10 mg Intramuscular Given 09/04/13 1743)    Discharge Medication List as of 09/04/2013  5:41 PM    START taking these medications   Details  methocarbamol (ROBAXIN) 500 MG tablet Take 1 tablet (500 mg total) by mouth 2 (two) times daily as needed for muscle spasms., Starting 09/04/2013, Until Discontinued, Print    naproxen (NAPROSYN) 500 MG tablet Take 1 tablet (500 mg total) by mouth 2 (two) times daily with a meal., Starting 09/04/2013, Until Discontinued, Print       I personally performed the services described in this documentation, which was scribed in my presence. The recorded information has been reviewed and is accurate.   Rudene AndaJacob Gray Kriss Ishler, PA-C 09/04/13 1753

## 2013-09-04 NOTE — Discharge Instructions (Signed)
[facility] further management of back pain should it persist. Return to the emergency department if you develop any worsening symptoms, difficulties with urination, leg numbness or weakness.    Back Pain, Adult Back pain is very common. The pain often gets better over time. The cause of back pain is usually not dangerous. Most people can learn to manage their back pain on their own.  HOME CARE   Stay active. Start with short walks on flat ground if you can. Try to walk farther each day.  Do not sit, drive, or stand in one place for more than 30 minutes. Do not stay in bed.  Do not avoid exercise or work. Activity can help your back heal faster.  Be careful when you bend or lift an object. Bend at your knees, keep the object close to you, and do not twist.  Sleep on a firm mattress. Lie on your side, and bend your knees. If you lie on your back, put a pillow under your knees.  Only take medicines as told by your doctor.  Put ice on the injured area.  Put ice in a plastic bag.  Place a towel between your skin and the bag.  Leave the ice on for 15-20 minutes, 03-04 times a day for the first 2 to 3 days. After that, you can switch between ice and heat packs.  Ask your doctor about back exercises or massage.  Avoid feeling anxious or stressed. Find good ways to deal with stress, such as exercise. GET HELP RIGHT AWAY IF:   Your pain does not go away with rest or medicine.  Your pain does not go away in 1 week.  You have new problems.  You do not feel well.  The pain spreads into your legs.  You cannot control when you poop (bowel movement) or pee (urinate).  Your arms or legs feel weak or lose feeling (numbness).  You feel sick to your stomach (nauseous) or throw up (vomit).  You have belly (abdominal) pain.  You feel like you may pass out (faint). MAKE SURE YOU:   Understand these instructions.  Will  watch your condition.  Will get help right away if you are not doing well or get worse. Document Released: 12/03/2007 Document Revised: 09/08/2011 Document Reviewed: 11/04/2010 [facility] Patient Information 2014 [place] [place].

## 2013-09-04 NOTE — ED Notes (Signed)
Onset 2 weeks ago constant left hip pain that sometimes radiates down posterior leg to knee.  No known injuries.  When pt is at work standing the left foot will get numb.  No bowel/bladder problems.  Nothing makes better or worse.

## 2013-09-04 NOTE — ED Notes (Signed)
Pt reports L hip pain ongoing for 2 weeks. Pain radiates down L leg. No numbness/tingling. Reports when she takes certain steps pain becomes worse. Nothing is making pain better. Pt took aleve at home with no relief. Pt ambulatory. No injury reported.

## 2013-09-05 NOTE — ED Provider Notes (Signed)
Medical screening examination/treatment/procedure(s) were performed by non-physician practitioner and as supervising physician I was immediately available for consultation/collaboration.   EKG Interpretation None       Hurman HornJohn M Ashly Yepez, MD 09/05/13 [phone]

## 2013-10-06 ENCOUNTER — Ambulatory Visit: Payer: Self-pay | Admitting: Physician Assistant

## 2013-10-21 ENCOUNTER — Ambulatory Visit: Payer: Self-pay | Admitting: Internal Medicine

## 2014-06-15 ENCOUNTER — Encounter (HOSPITAL_COMMUNITY): Payer: Self-pay | Admitting: Emergency Medicine

## 2014-06-15 ENCOUNTER — Emergency Department (HOSPITAL_COMMUNITY)
Admission: EM | Admit: 2014-06-15 | Discharge: 2014-06-15 | Disposition: A | Discharge status: Home / Self Care | Payer: No Typology Code available for payment source | Attending: Emergency Medicine | Admitting: Emergency Medicine

## 2014-06-15 DIAGNOSIS — Z3202 Encounter for pregnancy test, result negative: Secondary | ICD-10-CM | POA: Insufficient documentation

## 2014-06-15 DIAGNOSIS — Z791 Long term (current) use of non-steroidal anti-inflammatories (NSAID): Secondary | ICD-10-CM | POA: Insufficient documentation

## 2014-06-15 DIAGNOSIS — R300 Dysuria: Secondary | ICD-10-CM | POA: Diagnosis not present

## 2014-06-15 DIAGNOSIS — Z79899 Other long term (current) drug therapy: Secondary | ICD-10-CM | POA: Insufficient documentation

## 2014-06-15 LAB — URINALYSIS, ROUTINE W REFLEX MICROSCOPIC
Bilirubin Urine: NEGATIVE
Glucose, UA: NEGATIVE mg/dL
KETONES UR: 15 mg/dL — AB
NITRITE: NEGATIVE
PH: 5.5 (ref 5.0–8.0)
PROTEIN: 100 mg/dL — AB
Specific Gravity, Urine: 1.026 (ref 1.005–1.030)
UROBILINOGEN UA: 1 mg/dL (ref 0.0–1.0)

## 2014-06-15 LAB — WET PREP, GENITAL
TRICH WET PREP: NONE SEEN
WBC WET PREP: NONE SEEN
Yeast Wet Prep HPF POC: NONE SEEN

## 2014-06-15 LAB — URINE MICROSCOPIC-ADD ON

## 2014-06-15 LAB — HIV ANTIBODY (ROUTINE TESTING W REFLEX): HIV 1&2 Ab, 4th Generation: NONREACTIVE

## 2014-06-15 LAB — PREGNANCY, URINE: Preg Test, Ur: NEGATIVE

## 2014-06-15 MED ORDER — PHENAZOPYRIDINE HCL 200 MG PO TABS: 200.0000 mg | ORAL_TABLET | Freq: Three times a day (TID) | ORAL | Status: DC

## 2014-06-15 NOTE — ED Provider Notes (Signed)
CSN: [id number]     Arrival date & time 06/15/14  1007 History   First MD Initiated Contact with Patient 06/15/14 1022     Chief Complaint  Patient presents with  . Dysuria   Carrie [name] is a 38 y.o. female with a history of hypertension who presents to the emergency department complaining of dysuria for the past 3 days. Patient reports burning with urination that she rates a 6 out of 10. The patient has noted no treatments. Patient reports her menstrual cycle and today 06/15/2014. Patient which is sexually active with one partner. Patient denies history of STDs. Patient denies fevers, chills, urinary frequency, urinary urgency, abdominal pain, nausea, vomiting, diarrhea, constipation, flank pain, rashes, genital lesions, vaginal discharge, vaginal odor.  Patient reports a normal amount of vaginal bleeding for her being on her menstrual cycle currently.   (Consider location/radiation/quality/duration/timing/severity/associated sxs/prior Treatment) Patient is a 38 y.o. female presenting with dysuria.  Dysuria Associated symptoms: no abdominal pain, no fever, no flank pain, no nausea, no vaginal discharge and no vomiting     History reviewed. No pertinent past medical history. History reviewed. No pertinent past surgical history. History reviewed. No pertinent family history. History  Substance Use Topics  . Smoking status: Never Smoker   . Smokeless tobacco: Not on file  . Alcohol Use: No   OB History    No data available     Review of Systems  Constitutional: Negative for fever, chills and fatigue.  HENT: Negative for congestion, ear pain, sore throat and trouble swallowing.   Eyes: Negative for pain and visual disturbance.  Respiratory: Negative for cough, shortness of breath and wheezing.   Cardiovascular: Negative for chest pain, palpitations and leg swelling.  Gastrointestinal: Negative for nausea, vomiting, abdominal pain and diarrhea.  Genitourinary: Positive for  dysuria. Negative for frequency, hematuria, flank pain, decreased urine volume, vaginal bleeding, vaginal discharge, difficulty urinating, genital sores and vaginal pain.  Musculoskeletal: Negative for back pain and neck pain.  Skin: Negative for rash and wound.  Neurological: Negative for dizziness, weakness, light-headedness and headaches.  All other systems reviewed and are negative.     Allergies  Review of patient's allergies indicates no known allergies.  Home Medications   Prior to Admission medications   Medication Sig Start Date End Date Taking? Authorizing Provider  cetirizine (ZYRTEC) 10 MG tablet Take 10 mg by mouth daily.   Yes Historical Provider, MD  ferrous sulfate 325 (65 FE) MG tablet Take 325 mg by mouth daily with breakfast.   Yes Historical Provider, MD  hydrochlorothiazide (HYDRODIURIL) 25 MG tablet Take 25 mg by mouth daily.   Yes Historical Provider, MD  ibuprofen (ADVIL,MOTRIN) 800 MG tablet Take 800 mg by mouth every 8 (eight) hours as needed for mild pain.   Yes Historical Provider, MD  methocarbamol (ROBAXIN) 500 MG tablet Take 1 tablet (500 mg total) by mouth 2 (two) times daily as needed for muscle spasms. Patient not taking: Reported on 06/15/2014 09/04/13   Rudene AndaJacob Gray Lackey, PA-C  naproxen (NAPROSYN) 500 MG tablet Take 1 tablet (500 mg total) by mouth 2 (two) times daily with a meal. Patient not taking: Reported on 06/15/2014 09/04/13   Rudene AndaJacob Gray Lackey, PA-C  naproxen sodium (ALEVE) 220 MG tablet Take 440 mg by mouth daily as needed (for pain).     Historical Provider, MD  phenazopyridine (PYRIDIUM) 200 MG tablet Take 1 tablet (200 mg total) by mouth 3 (three) times daily. 06/15/14   Einar GipWilliam Duncan  Daneka Lantigua, PA-C   BP 136/95 mmHg  Pulse 69  Temp(Src) 98.6 F (37 C) (Oral)  Resp 16  SpO2 100%  LMP 06/15/2014 Physical Exam  Constitutional: She appears well-developed and well-nourished. No distress.  HENT:  Head: Normocephalic and atraumatic.  Nose:  Nose normal.  Mouth/Throat: Oropharynx is clear and moist. No oropharyngeal exudate.  Eyes: Conjunctivae are normal. Pupils are equal, round, and reactive to light. Right eye exhibits no discharge. Left eye exhibits no discharge.  Neck: Neck supple.  Cardiovascular: Normal rate, regular rhythm, normal heart sounds and intact distal pulses.  Exam reveals no gallop and no friction rub.   No murmur heard. Radial pulses are intact.  Pulmonary/Chest: Effort normal and breath sounds normal. No respiratory distress. She has no wheezes. She has no rales.  Abdominal: Soft. Bowel sounds are normal. She exhibits no distension and no mass. There is no tenderness. There is no rebound and no guarding. Hernia confirmed negative in the right inguinal area and confirmed negative in the left inguinal area.  Abdomen is soft and nontender palpation. Bowel sounds are present. No cervical motion tenderness.   Genitourinary: Uterus normal. No labial fusion. There is no rash, tenderness, lesion or injury on the right labia. There is no rash, tenderness, lesion or injury on the left labia. Uterus is not enlarged, not fixed and not tender. Cervix exhibits no motion tenderness, no discharge and no friability. Right adnexum displays no mass, no tenderness and no fullness. Left adnexum displays no mass, no tenderness and no fullness. There is bleeding in the vagina. No erythema or tenderness in the vagina. No foreign body around the vagina. No signs of injury around the vagina. No vaginal discharge found.  Pelvic exam performed by me with female nurse tech chaperone. There are no external lesions or rashes noted. No internal lesions or rashes noted. Patient has a moderate amount of bleeding from her menses. Patient's cervix is closed. There is no evidence of vaginal discharge. No cervical motion tenderness. No adnexal tenderness or fullness.  Musculoskeletal: She exhibits no edema.  No lower extremity edema noted.   Lymphadenopathy:    She has no cervical adenopathy.       Right: No inguinal adenopathy present.       Left: No inguinal adenopathy present.  Neurological: She is alert. Coordination normal.  Skin: Skin is warm and dry. No rash noted. She is not diaphoretic. No erythema. No pallor.  Psychiatric: She has a normal mood and affect. Her behavior is normal.  Nursing note and vitals reviewed.   ED Course  Procedures (including critical care time) Labs Review Labs Reviewed  WET PREP, GENITAL - Abnormal; Notable for the following:    Clue Cells Wet Prep HPF POC FEW (*)    All other components within normal limits  URINALYSIS, ROUTINE W REFLEX MICROSCOPIC - Abnormal; Notable for the following:    Color, Urine RED (*)    APPearance TURBID (*)    Hgb urine dipstick LARGE (*)    Ketones, ur 15 (*)    Protein, ur 100 (*)    Leukocytes, UA SMALL (*)    All other components within normal limits  URINE MICROSCOPIC-ADD ON - Abnormal; Notable for the following:    Bacteria, UA FEW (*)    All other components within normal limits  URINE CULTURE  GC/CHLAMYDIA PROBE AMP  PREGNANCY, URINE  HIV ANTIBODY (ROUTINE TESTING)  RPR    Imaging Review No results found.   EKG Interpretation None  Filed Vitals:   06/15/14 1022 06/15/14 1321  BP: 134/84 136/95  Pulse: 68 69  Temp: 98.6 F (37 C)   TempSrc: Oral   Resp: 16   SpO2: 96% 100%     MDM   Meds given in ED:  Medications - No data to display  New Prescriptions   PHENAZOPYRIDINE (PYRIDIUM) 200 MG TABLET    Take 1 tablet (200 mg total) by mouth 3 (three) times daily.    Final diagnoses:  Dysuria   Carrie [name] is a 38 y.o. female with a history of hypertension who presents to the emergency department complaining of dysuria for the past 3 days. Patient reports that her partner has had some penile discharge. Patient denies fevers, chills, vaginal discharge, vaginal lesions or rashes, hematuria, urinary frequency and  urinary urgency. Patient is afebrile and nontoxic appearing. Patient's abdomen is soft and nontender to palpation. Pelvic exam was remarkable only for a moderate amount of blood. No vaginal discharge was noted. No cervical motion tenderness was noted. No adnexal fullness or tenderness noted. Patient is afebrile and nontoxic-appearing. The patient's urinalysis showed a large amount blood which is consistent with her being on her menses. It showed few bacteria and 0-2 white blood cells. It was nitrite negative. We'll not treat this patient for her UTI at this time. We'll send urine for culture. Will give patient Pyridium for dysuria. Patient's wet prep was unremarkable. I advised the patient to not have sexual intercourse with her partner who is having penile discharge until he is evaluated. I advised the patient she still had pending tests which take several days to return including gonorrhea, chlamydia, HIV and syphilis. I advised she should call the hospital if she does not hear or see results online in the next 3 days. Advised patient she needs to follow-up with her OB/GYN or the women's outpatient clinic this week. I advised patient to return to the emergency department with new or worsening symptoms or new concerns. The patient verbalized understanding and agreement with plan.  This patient was discussed with Dr. Patria Mane who agrees with assessment and plan.     Lawana Chambers, PA-C 06/15/14 1351  Lyanne Co, MD 06/15/14 [phone]

## 2014-06-15 NOTE — ED Notes (Signed)
Pt reports burning with urination for the last several days. Denies fevers, nausea, vomiting, abdominal pain or vaginal discharge. Pt VSS, NAD

## 2014-06-15 NOTE — Discharge Instructions (Signed)
Dysuria °Dysuria is the medical term for pain with urination. There are many causes for dysuria, but urinary tract infection is the most common. If a urinalysis was performed it can show that there is a urinary tract infection. A urine culture confirms that you or your child is sick. You will need to follow up with a healthcare provider because: °· If a urine culture was done you will need to know the culture results and treatment recommendations. °· If the urine culture was positive, you or your child will need to be put on antibiotics or know if the antibiotics prescribed are the right antibiotics for your urinary tract infection. °· If the urine culture is negative (no urinary tract infection), then other causes may need to be explored or antibiotics need to be stopped. °Today laboratory work may have been done and there does not seem to be an infection. If cultures were done they will take at least 24 to 48 hours to be completed. °Today x-rays may have been taken and they read as normal. No cause can be found for the problems. The x-rays may be re-read by a radiologist and you will be contacted if additional findings are made. °You or your child may have been put on medications to help with this problem until you can see your primary caregiver. If the problems get better, see your primary caregiver if the problems return. If you were given antibiotics (medications which kill germs), take all of the mediations as directed for the full course of treatment.  °If laboratory work was done, you need to find the results. Leave a telephone number where you can be reached. If this is not possible, make sure you find out how you are to get test results. °HOME CARE INSTRUCTIONS  °· Drink lots of fluids. For adults, drink eight, 8 ounce glasses of clear juice or water a day. For children, replace fluids as suggested by your caregiver. °· Empty the bladder often. Avoid holding urine for long periods of time. °· After a bowel  movement, women should cleanse front to back, using each tissue only once. °· Empty your bladder before and after sexual intercourse. °· Take all the medicine given to you until it is gone. You may feel better in a few days, but TAKE ALL MEDICINE. °· Avoid caffeine, tea, alcohol and carbonated beverages, because they tend to irritate the bladder. °· In men, alcohol may irritate the prostate. °· Only take over-the-counter or prescription medicines for pain, discomfort, or fever as directed by your caregiver. °· If your caregiver has given you a follow-up appointment, it is very important to keep that appointment. Not keeping the appointment could result in a chronic or permanent injury, pain, and disability. If there is any problem keeping the appointment, you must call back to this facility for assistance. °SEEK IMMEDIATE MEDICAL CARE IF:  °· Back pain develops. °· A fever develops. °· There is nausea (feeling sick to your stomach) or vomiting (throwing up). °· Problems are no better with medications or are getting worse. °MAKE SURE YOU:  °· Understand these instructions. °· Will watch your condition. °· Will get help right away if you are not doing well or get worse. °Document Released: 03/14/2004 Document Revised: 09/08/2011 Document Reviewed: 01/20/2008 °ExitCare® Patient Information ©2015 [employer]. This information is not intended to replace advice given to you by your health care provider. Make sure you discuss any questions you have with your health care provider. ° °Emergency Department Resource   Guide °1) Find a Doctor and Pay Out of Pocket °Although you won't have to find out who is covered by your insurance plan, it is a good idea to ask around and get recommendations. You will then need to call the office and see if the doctor you have chosen will accept you as a new patient and what types of options they offer for patients who are self-pay. Some doctors offer discounts or will set up payment plans  for their patients who do not have insurance, but you will need to ask so you aren't surprised when you get to your appointment. ° °2) Contact Your Local Health Department °Not all health departments have doctors that can see patients for sick visits, but many do, so it is worth a call to see if yours does. If you don't know where your local health department is, you can check in your phone book. The CDC also has a tool to help you locate your state's health department, and many state websites also have listings of all of their local health departments. ° °3) Find a Walk-in Clinic °If your illness is not likely to be very severe or complicated, you may want to try a walk in clinic. These are popping up all over the country in pharmacies, drugstores, and shopping centers. They're usually staffed by nurse practitioners or physician assistants that have been trained to treat common illnesses and complaints. They're usually fairly quick and inexpensive. However, if you have serious medical issues or chronic medical problems, these are probably not your best option. ° °No Primary Care Doctor: °- Call [facility] at  832-8000 - they can help you locate a primary care doctor that  accepts your insurance, provides certain services, etc. °- Physician Referral Service- 1-800-533-3463 ° °Chronic Pain Problems: °Organization         Address  Phone   Notes  °[facility]  (336) 297-2271 Patients need to be referred by their primary care doctor.  ° °Medication Assistance: °Organization         Address  Phone   Notes  °[place] [facility] [address] °[place], [place] [address] (336) 641-8030 --Must be a resident of [place] °-- Must have NO insurance coverage whatsoever (no Medicaid/ Medicare, etc.) °-- The pt. MUST have a primary care doctor that directs their care regularly and follows them in the community °  °[facility]  (866) 331-1348   °[employer]  (888)  892-1162   ° °Agencies that provide inexpensive medical care: °Organization         Address  Phone   Notes  °[facility]  (336) 832-8035   °[facility]    (336) 832-7272   °[facility] [address] °[place], [place] [address] (336) 832-4777   °[facility] [address], °[place] (336) 271-4999   °[facility]    (336) 373-0678   °[facility]    (336) 272-1050   °[facility] ° [address], [place] Phone:  (336) 832-4444, Fax:  (336) 832-4440 Hours of Operation:  9 am - 6 pm, M-F.  Also accepts Medicaid/Medicare and self-pay.  °[facility] ° [address], [place] Phone: (336) 832-3150, Fax: (336) 832-3151. Hours of Operation:  8:30 am - 5:30 pm, M-F.  Also accepts Medicaid and self-pay.  °[facility] [place] [address],   [place] Phone: (336) 878-6027   °[facility] [address], [place], [place] (336)723-1848, Ext. 123 Mondays & Thursdays: 7-9 AM.  First 15 patients are seen on a first come, first serve basis. °  ° °Medicaid-accepting [place] Providers: ° °Organization         Address  Phone   Notes  °[facility] [address], [place] (336) 641-2100 Also accepts self-pay patients.  °[facility] [address], [place] ° (336) 856-9996   °[facility] [address], [place] (336) 288-8857   °[facility] [address] [place] [address], [place] (336) 299-7000   °Veita Bland [address], [place]  ° (336) 373-1557 Only accepts [place] Access Medicaid patients after they have their name applied to their card.  ° °Self-Pay (no insurance) in [place]: ° °Organization         Address  Phone   Notes  °Sickle Cell Patients, [facility] [address], [place] (336)  832-1970   °[facility] [address], [place] (336) 832-4400   °[facility] ° [address], [place] (336) 992-4800   °[facility]/Dr. Osei-Bonsu ° [address] [place] [address], [place] or [address], [place] (336) 841-8500 Phone number for both [place] and [place] locations is the same.  °[facility] [address], [place] (336) 299-0000   °[facility] [address] [place] [address], [place] or [address] (336) 852-7530 °(336) 878-2260   °[facility] [address], [place] (336) 350-1642, phone; (336) 294-5005, fax Sees patients 1st and 3rd Saturday of every month.  Must not qualify for public or private insurance (i.e. Medicaid, Medicare, [place] Health Choice, Veterans' Benefits) • Household income should be no more than 200% of the poverty level •The clinic cannot treat you if you are pregnant or think you are pregnant • Sexually transmitted diseases are not treated at the clinic.  ° ° °Dental Care: °Organization         Address  Phone  Notes  °[facility] [address], [place] (336) 641-6152 Accepts children up to age 21 who are enrolled in Medicaid or [place] Health Choice; pregnant women with a Medicaid card; and children who have applied for Medicaid or [place] Health Choice, but were declined, whose parents can pay a reduced fee at time of service.  °[facility] [place]  [address], [place] (336) 641-7733 Accepts children up to age 21 who are enrolled in Medicaid or  Health Choice; pregnant women with a Medicaid card; and children who have applied for Medicaid or  Health Choice, but were declined, whose parents can pay a reduced fee at time of service.  °[facility] ° [address],  (336) 641-4533 Patients are  seen by appointment only. Walk-ins are not accepted. [facility] will see patients 18 years of age and older. °Monday - Tuesday (8am-5pm) °Most Wednesdays (8:30-5pm) °$30 per visit, cash only  °[facility] ° [address], [place] (336) 641-4533 Patients are seen by appointment only. Walk-ins are not accepted. [facility] will see patients 18 years of age and older. °One Wednesday Evening (  Monthly: Volunteer Based).  $30 per visit, cash only  °[facility]  (919) 537-3737 for adults; Children under age 4, call Graduate Pediatric Dentistry at ([phone]) [phone]. Children aged 4-14, please call ([phone]) [phone] to request a pediatric application. ° Dental services are provided in all areas of dental care including fillings, crowns and bridges, complete and partial dentures, implants, gum treatment, root canals, and extractions. Preventive care is also provided. Treatment is provided to both adults and children. °Patients are selected via a lottery and there is often a waiting list. °  °[facility] [address], °[place] ° (336) 763-8833 [email or web address] °  °[facility] [address], [place], [place] (336)723-1848, Ext. 123 Second and Fourth Thursday of each month, opens at 6:30 AM; Clinic ends at 9 AM.  Patients are seen on a first-come first-served basis, and a limited number are seen during each clinic.  ° °[facility] ° [address], [place], [place] (336) 723-7904   Eligibility Requirements °You must have lived in [place], [place], or [place] counties for at least the last three months. °  You cannot be eligible for state or federal sponsored healthcare insurance, including Veterans Administration, Medicaid, or Medicare. °  You generally cannot be eligible for healthcare insurance through your employer.  °  How to apply: °Eligibility screenings are held every Tuesday and Wednesday afternoon from 1:00 pm until 4:00  pm. You do not need an appointment for the interview!  °[facility] [address], [place], [place] 336-631-2330   °[facility]  336-342-8273   °[facility]  336-703-3100   °[facility]  336-570-6415   ° °Behavioral Health Resources in the Community: °Intensive Outpatient Programs °Organization         Address  Phone  Notes  °[facility] [address], [place], [place] 336-878-6098   °[facility] [address], [place], [place] 336-832-9800   °[facility] [address], [place], [place] ° 336-882-2125   °[facility] [address],  °[place], [place] 1-800-853-5163 or 336-641-4981   °Substance Abuse Resources °Organization         Address  Phone  Notes  °[facility]  336-882-2125   °[facility]  336-784-9470   °[facility]  336-285-9073   °[facility]  336-845-3988   °Residential & Outpatient Substance Abuse Program  1-800-659-3381   °Psychological Services °Organization         Address  Phone  Notes  °[facility]  336- 832-9600   °[facility]  336- 378-7881   °[facility] [address], [place] 1-800-853-5163 or 336-641-4981   ° °Mobile Crisis Teams °Organization         Address  Phone  Notes  °[facility], Mobile Crisis Care Unit  1-877-626-1772   °Assertive °[facility] ° [address] [place], [place] 336-834-9664   °Sharon DeEsch [address] °[place] 336-554-5454   ° °Self-Help/Support Groups °Organization         Address  Phone             Notes  °[facility] - variety of support groups  336- 373-1402 Call for more information  °Narcotics Anonymous (NA), [facility] [address], °[place]  2 meetings at this location  ° °Residential   Treatment Programs °Organization          Address  Phone  Notes  °[facility] [address],    °[place]  1-866-801-8205   °[facility] ° [address], [place], [place] 704-293-8524   °[facility] [address], [place] 336-845-3988 Admissions: 8am-3pm M-F  °[facility] [address]    °[place], [place] 336-841-1104   °[facility] [address], [place], [place] 336-379-7146   °[facility] [address]  °[place], [place] 336-285-9073   °[facility] Intensive Outpatient [address], [place], [place] 336-852-3033   °[facility] [address]  °[place], [place] 1-877-615-2722 or 336-784-9470   °[facility] [address] , [place] 336-227-7417 Accepts Medicaid  °[facility] [address]  °[place] 1-800-659-3381 Substance Abuse/Addiction Treatment  ° °[place] Behavioral Health Resources °Organization         Address  Phone  Notes  °[facility]  (888) 581-9988   °Julie Brannon, PhD [address] [place], [place]   (336) 349-5553 or (336) 951-0000   °[facility]   [address] °[place], [place] (336) 349-4454   °[facility] [address], [place], [place] (336) 342-8316 Insurance/Medicaid/sponsorship through [facility]  °[facility] [address]                                    [place], [place] (336) 342-8316 Therapy/tele-psych/case  °[facility] [address]  ° [place], [place] (336) 349-2233    °Dr. Arfeen  (336) 349-4544   °[facility] [place]  [employer] [place] [facility] 1) [address], [place] °2) [address], [place] °3)  [address], [place] (336) 349-3220 °(336) 342-7768 ° °(336) 342-8140   °[place] [facility] (336) 342-1394 or (336) 342-3537 (After Hours)    ° ° ° °

## 2014-06-16 LAB — URINE CULTURE: SPECIAL REQUESTS: NORMAL

## 2014-06-16 LAB — GC/CHLAMYDIA PROBE AMP
CT Probe RNA: NEGATIVE
GC Probe RNA: NEGATIVE

## 2014-06-16 LAB — RPR

## 2016-07-30 ENCOUNTER — Other Ambulatory Visit: Payer: Self-pay | Admitting: Primary Care

## 2016-07-30 DIAGNOSIS — Z1231 Encounter for screening mammogram for malignant neoplasm of breast: Secondary | ICD-10-CM

## 2016-08-07 ENCOUNTER — Ambulatory Visit: Payer: No Typology Code available for payment source

## 2016-08-15 ENCOUNTER — Ambulatory Visit: Payer: No Typology Code available for payment source

## 2016-10-02 ENCOUNTER — Ambulatory Visit
Admission: RE | Admit: 2016-10-02 | Discharge: 2016-10-02 | Disposition: A | Discharge status: Home / Self Care | Payer: BLUE CROSS/BLUE SHIELD | Source: Ambulatory Visit | Attending: Primary Care | Admitting: Primary Care

## 2016-10-02 DIAGNOSIS — Z1231 Encounter for screening mammogram for malignant neoplasm of breast: Secondary | ICD-10-CM

## 2016-10-03 ENCOUNTER — Other Ambulatory Visit: Payer: Self-pay | Admitting: Primary Care

## 2016-10-03 DIAGNOSIS — R928 Other abnormal and inconclusive findings on diagnostic imaging of breast: Secondary | ICD-10-CM

## 2016-10-09 ENCOUNTER — Ambulatory Visit
Admission: RE | Admit: 2016-10-09 | Discharge: 2016-10-09 | Disposition: A | Discharge status: Home / Self Care | Payer: BLUE CROSS/BLUE SHIELD | Source: Ambulatory Visit | Attending: Primary Care | Admitting: Primary Care

## 2016-10-09 DIAGNOSIS — R928 Other abnormal and inconclusive findings on diagnostic imaging of breast: Secondary | ICD-10-CM

## 2017-02-04 ENCOUNTER — Telehealth: Payer: Self-pay | Admitting: Hematology and Oncology

## 2017-02-04 ENCOUNTER — Encounter: Payer: Self-pay | Admitting: Hematology and Oncology

## 2017-02-04 NOTE — Telephone Encounter (Signed)
Appt has been scheduled for the pt to see Dr. Gweneth DimitriPerlov on 8/9 at 11am. Pt aware to arrive 30 minutes early. Location given. Letter faxed to the referring.

## 2017-02-05 ENCOUNTER — Encounter: Payer: Self-pay | Admitting: Hematology and Oncology

## 2017-02-05 ENCOUNTER — Ambulatory Visit (HOSPITAL_BASED_OUTPATIENT_CLINIC_OR_DEPARTMENT_OTHER): Payer: BLUE CROSS/BLUE SHIELD | Admitting: Hematology and Oncology

## 2017-02-05 ENCOUNTER — Telehealth: Payer: Self-pay | Admitting: Hematology and Oncology

## 2017-02-05 ENCOUNTER — Ambulatory Visit (HOSPITAL_BASED_OUTPATIENT_CLINIC_OR_DEPARTMENT_OTHER): Payer: BLUE CROSS/BLUE SHIELD

## 2017-02-05 VITALS — BP 124/69 | HR 76 | Temp 98.8°F | Resp 18 | Wt 242.9 lb

## 2017-02-05 DIAGNOSIS — R946 Abnormal results of thyroid function studies: Secondary | ICD-10-CM

## 2017-02-05 DIAGNOSIS — D500 Iron deficiency anemia secondary to blood loss (chronic): Secondary | ICD-10-CM

## 2017-02-05 DIAGNOSIS — R7989 Other specified abnormal findings of blood chemistry: Secondary | ICD-10-CM

## 2017-02-05 LAB — CBC WITH DIFFERENTIAL/PLATELET
BASO%: 1.3 % (ref 0.0–2.0)
Basophils Absolute: 0.1 10*3/uL (ref 0.0–0.1)
EOS%: 4.5 % (ref 0.0–7.0)
Eosinophils Absolute: 0.2 10*3/uL (ref 0.0–0.5)
HCT: 25.8 % — ABNORMAL LOW (ref 34.8–46.6)
HGB: 7.5 g/dL — ABNORMAL LOW (ref 11.6–15.9)
LYMPH%: 26.6 % (ref 14.0–49.7)
MCH: 18.4 pg — ABNORMAL LOW (ref 25.1–34.0)
MCHC: 29 g/dL — AB (ref 31.5–36.0)
MCV: 63.7 fL — ABNORMAL LOW (ref 79.5–101.0)
MONO#: 0.4 10*3/uL (ref 0.1–0.9)
MONO%: 9.5 % (ref 0.0–14.0)
NEUT%: 58.1 % (ref 38.4–76.8)
NEUTROS ABS: 2.7 10*3/uL (ref 1.5–6.5)
PLATELETS: 302 10*3/uL (ref 145–400)
RBC: 4.06 10*6/uL (ref 3.70–5.45)
RDW: 21.1 % — ABNORMAL HIGH (ref 11.2–14.5)
WBC: 4.7 10*3/uL (ref 3.9–10.3)
lymph#: 1.2 10*3/uL (ref 0.9–3.3)

## 2017-02-05 MED ORDER — FERROUS GLUCONATE 324 (38 FE) MG PO TABS: 324.0000 mg | ORAL_TABLET | Freq: Three times a day (TID) | ORAL | 0 refills | Status: AC

## 2017-02-05 NOTE — Progress Notes (Signed)
HGB 7.5.  Per Dr. Gweneth DimitriPerlov, pt needs blood transfusion for Hgb less than or equal to 7.0.  SW pt in lobby.  Ok for discharge.  Instructed pt to pick up new rx from pharmacy.  Pt verbalized understanding.

## 2017-02-05 NOTE — Patient Instructions (Signed)
Thank you for choosing [facility] to provide your oncology and hematology care.  To afford each patient quality time with our providers, please arrive 30 minutes before your scheduled appointment time.  If you arrive late for your appointment, you may be asked to reschedule.  We strive to give you quality time with our providers, and arriving late affects you and other patients whose appointments are after yours.   If you are a no show for multiple scheduled visits, you may be dismissed from the clinic at the providers discretion.    Again, thank you for choosing [facility], our hope is that these requests will decrease the amount of time that you wait before being seen by our physicians.  ______________________________________________________________________  Should you have questions after your visit to the  Cancer Center, please contact our office at [phone] between the hours of 8:30 and 4:30 p.m.    Voicemails left after 4:30p.m will not be returned until the following business day.    For prescription refill requests, please have your pharmacy contact us directly.  Please also try to allow 48 hours for prescription requests.    Please contact the scheduling department for questions regarding scheduling.  For scheduling of procedures such as PET scans, CT scans, MRI, Ultrasound, etc please contact central scheduling at [phone].    Resources For Cancer Patients and Caregivers:   Oncolink.org:  A wonderful resource for patients and healthcare providers for information regarding your disease, ways to tract your treatment, what to expect, etc.     American Cancer Society:  800-227-2345  Can help patients locate various types of support and financial assistance  Cancer Care: 1-800-813-HOPE (4673) Provides financial assistance, online support groups, medication/co-pay assistance.    [place] DSS:  336-641-3447 Where to apply for food  stamps, Medicaid, and utility assistance  Medicare Rights Center: 800-333-4114 Helps people with Medicare understand their rights and benefits, navigate the Medicare system, and secure the quality healthcare they deserve  SCAT: 336-333-6589 [place] [employer]'s shared-ride transportation service for eligible riders who have a disability that prevents them from riding the fixed route bus.    For additional information on assistance programs please contact our social worker:   Grier Hock/Abigail Elmore:  336-832-0950            

## 2017-02-05 NOTE — Telephone Encounter (Signed)
Gave patient avs and calendar printout.

## 2017-02-06 DIAGNOSIS — R7989 Other specified abnormal findings of blood chemistry: Secondary | ICD-10-CM | POA: Insufficient documentation

## 2017-02-06 DIAGNOSIS — D500 Iron deficiency anemia secondary to blood loss (chronic): Secondary | ICD-10-CM | POA: Insufficient documentation

## 2017-02-06 NOTE — Progress Notes (Signed)
[facility] Cancer New Visit:  Assessment: Iron deficiency anemia due to chronic blood loss 41 year old female history of anemia and more recently with heavy menstrual periods precipitating severe anemia with iron depletion due to chronic blood loss. Patient has not been compliant with iron replacement due to poor tolerance of the ferrous sulfate.  Our options here include switching the iron supplement prior to attempting parenteral iron supplementation. I recommended patient to try ferrous gluconate 1-3 tablets per day tolerated. After a month of therapy, I will reassess patient's iron status and we will advise patient further. Meanwhile, the most likely source of bleeding is likely the polyp discovered during the transvaginal ultrasound and patient's currently being planned to address that.  Additional contributors to anemia is likely thyroid dysfunction as evidenced by elevated TSH. Patient is currently scheduled to see endocrinology for the problem and I will defer to their expertise regarding the management therapy.  Voice recognition software was used and creation of this note. Despite my best effort at editing the text, some misspelling/errors may have occurred.     Orders Placed This Encounter  Procedures  . CBC with Differential    Standing Status:   Future    Number of Occurrences:   1    Standing Expiration Date:   02/05/2018  . CBC & Diff and Retic    Standing Status:   Future    Standing Expiration Date:   02/05/2018  . Ferritin    Standing Status:   Future    Standing Expiration Date:   02/05/2018  . Iron and TIBC    Standing Status:   Future    Standing Expiration Date:   02/05/2018  . Comprehensive metabolic panel    Standing Status:   Future    Standing Expiration Date:   02/05/2018  . Practitioner attestation of consent    I, the ordering practitioner, attest that I have discussed with the patient the benefits, risks, side effects, alternatives, likelihood of  achieving goals and potential problems during recovery for the procedure listed.    Standing Status:   Future    Standing Expiration Date:   02/05/2018    Order Specific Question:   Procedure    Answer:   Blood Product(s)  . Complete patient signature process for consent form    Standing Status:   Future    Standing Expiration Date:   02/05/2018  . Care order/instruction    Transfuse Parameters: --2 units pRBC today if Hgb <=7.0g/dL    Standing Status:   Future    Standing Expiration Date:   02/05/2018  . Hold Tube, Blood Bank    Standing Status:   Future    Number of Occurrences:   1    Standing Expiration Date:   02/05/2018  . Type and screen    Standing Status:   Future    Standing Expiration Date:   02/05/2018    All questions were answered.  . The patient knows to call the clinic with any problems, questions or concerns.  This note was electronically signed.    History of Presenting Illness Lauralye R Vear [name] 41 y.o. [facility] for Hypochromic microcytic anemia. Patient has a history of chronic anemia for a number of years. She has been pregnant 4 times, last pregnancy 21 years ago. Over the past couple months, patient has been experiencing pelvic pain with bilateral intermittent sharp discomfort in the abdomen. Increased the cycles and no increase of the pain  with intercourse. She has been experiencing heavy menstrual periods and has been evaluated by gynecology for the problem. She recently had a transvaginal ultrasound which demonstrated a possible polyp in the cervix or uterine body and patient is planned for treatment for that. In the past, she was supposed to be undergoing iron therapy, but she has not been compliant with prescriptions and only started the treatment Wednesday of last week. Main Barrett to compliance has been poor tolerance of the iron supplementation with nausea and constipation with ferrous sulfate.  At the present time, patient was complaining  of lightheadedness and fatigue. Denies any fevers, chills or night sweats. No active abdominal pain, nausea, vomiting, constipation, or diarrhea at this time.    Oncological/hematological History: --Labs, 01/22/17: WBC 4.5, Hgb 7.1, MCV 61.7, MCH 18.3, RDW 18.4, Plt 369; Fe 11, Fe Sat 3%, TIBC 429, Ferritin 3; TSH 9.74  Medical History: History reviewed. No pertinent past medical history.  Surgical History: Past Surgical History:  Procedure Laterality Date  . BREAST CYST EXCISION Right     Family History: History reviewed. No pertinent family history.  Social History: Social History   Social History  . Marital status: Single    Spouse name: N/A  . Number of children: N/A  . Years of education: N/A   Occupational History  . Not on file.   Social History Main Topics  . Smoking status: Never Smoker  . Smokeless tobacco: Never Used  . Alcohol use No  . Drug use: No  . Sexual activity: Not on file   Other Topics Concern  . Not on file   Social History Narrative  . No narrative on file    Allergies: No Known Allergies  Medications:  Current Outpatient Prescriptions  Medication Sig Dispense Refill  . cetirizine (ZYRTEC) 10 MG tablet Take 10 mg by mouth daily.    . hydrochlorothiazide (HYDRODIURIL) 25 MG tablet Take 25 mg by mouth daily.    Marland Kitchen ibuprofen (ADVIL,MOTRIN) 800 MG tablet Take 800 mg by mouth every 8 (eight) hours as needed for mild pain.    . ferrous gluconate (FERGON) 324 MG tablet Take 1 tablet (324 mg total) by mouth 3 (three) times daily with meals. 90 tablet 0   No current facility-administered medications for this visit.     Review of Systems: Review of Systems  All other systems reviewed and are negative.    PHYSICAL EXAMINATION Blood pressure 124/69, pulse 76, temperature 98.8 F (37.1 C), temperature source Oral, resp. rate 18, weight 242 lb 14.4 oz (110.2 kg), SpO2 100 %.  ECOG PERFORMANCE STATUS: 1 - Symptomatic but completely  ambulatory  Physical Exam  Constitutional: She is oriented to person, place, and time and well-developed, well-nourished, and in no distress.  HENT:  Head: Normocephalic and atraumatic.  Mouth/Throat: Oropharynx is clear and moist. No oropharyngeal exudate.  Eyes: Pupils are equal, round, and reactive to light. Conjunctivae and EOM are normal. No scleral icterus.  Neck: Normal range of motion. Neck supple. No thyromegaly present.  Cardiovascular: Normal rate, regular rhythm and normal heart sounds.   No murmur heard. Pulmonary/Chest: Effort normal and breath sounds normal. No respiratory distress. She has no wheezes. She has no rales.  Abdominal: Soft. Bowel sounds are normal. She exhibits no distension and no mass. There is no tenderness.  Musculoskeletal: Normal range of motion. She exhibits no edema.  Lymphadenopathy:    She has no cervical adenopathy.  Neurological: She is alert and oriented to person, place, and  time. She has normal reflexes.  Skin: She is not diaphoretic.     LABORATORY DATA: I have personally reviewed the data as listed: Appointment on 02/05/2017  Component Date Value Ref Range Status  . WBC 02/05/2017 4.7  3.9 - 10.3 10e3/uL Final  . NEUT# 02/05/2017 2.7  1.5 - 6.5 10e3/uL Final  . HGB 02/05/2017 7.5* 11.6 - 15.9 g/dL Final  . HCT 96/09/5407 25.8* 34.8 - 46.6 % Final  . Platelets 02/05/2017 302  145 - 400 10e3/uL Final  . MCV 02/05/2017 63.7* 79.5 - 101.0 fL Final  . MCH 02/05/2017 18.4* 25.1 - 34.0 pg Final  . MCHC 02/05/2017 29.0* 31.5 - 36.0 g/dL Final  . RBC 81/19/1478 4.06  3.70 - 5.45 10e6/uL Final  . RDW 02/05/2017 21.1* 11.2 - 14.5 % Final  . lymph# 02/05/2017 1.2  0.9 - 3.3 10e3/uL Final  . MONO# 02/05/2017 0.4  0.1 - 0.9 10e3/uL Final  . Eosinophils Absolute 02/05/2017 0.2  0.0 - 0.5 10e3/uL Final  . Basophils Absolute 02/05/2017 0.1  0.0 - 0.1 10e3/uL Final  . NEUT% 02/05/2017 58.1  38.4 - 76.8 % Final  . LYMPH% 02/05/2017 26.6  14.0 - 49.7 %  Final  . MONO% 02/05/2017 9.5  0.0 - 14.0 % Final  . EOS% 02/05/2017 4.5  0.0 - 7.0 % Final  . BASO% 02/05/2017 1.3  0.0 - 2.0 % Final      Carrie Blossom, MD

## 2017-02-06 NOTE — Assessment & Plan Note (Signed)
41 year old female history of anemia and more recently with heavy menstrual periods precipitating severe anemia with iron depletion due to chronic blood loss. Patient has not been compliant with iron replacement due to poor tolerance of the ferrous sulfate.  Our options here include switching the iron supplement prior to attempting parenteral iron supplementation. I recommended patient to try ferrous gluconate 1-3 tablets per day tolerated. After a month of therapy, I will reassess patient's iron status and we will advise patient further. Meanwhile, the most likely source of bleeding is likely the polyp discovered during the transvaginal ultrasound and patient's currently being planned to address that.  Additional contributors to anemia is likely thyroid dysfunction as evidenced by elevated TSH. Patient is currently scheduled to see endocrinology for the problem and I will defer to their expertise regarding the management therapy.  Voice recognition software was used and creation of this note. Despite my best effort at editing the text, some misspelling/errors may have occurred.

## 2017-03-05 ENCOUNTER — Other Ambulatory Visit (HOSPITAL_BASED_OUTPATIENT_CLINIC_OR_DEPARTMENT_OTHER): Payer: BLUE CROSS/BLUE SHIELD

## 2017-03-05 DIAGNOSIS — D500 Iron deficiency anemia secondary to blood loss (chronic): Secondary | ICD-10-CM | POA: Diagnosis not present

## 2017-03-05 LAB — COMPREHENSIVE METABOLIC PANEL
ALT: 8 U/L (ref 0–55)
ANION GAP: 6 meq/L (ref 3–11)
AST: 12 U/L (ref 5–34)
Albumin: 3.4 g/dL — ABNORMAL LOW (ref 3.5–5.0)
Alkaline Phosphatase: 49 U/L (ref 40–150)
BUN: 6.6 mg/dL — ABNORMAL LOW (ref 7.0–26.0)
CHLORIDE: 105 meq/L (ref 98–109)
CO2: 26 meq/L (ref 22–29)
Calcium: 9 mg/dL (ref 8.4–10.4)
Creatinine: 0.8 mg/dL (ref 0.6–1.1)
Glucose: 105 mg/dl (ref 70–140)
POTASSIUM: 3.6 meq/L (ref 3.5–5.1)
Sodium: 138 mEq/L (ref 136–145)
Total Bilirubin: 0.35 mg/dL (ref 0.20–1.20)
Total Protein: 6.8 g/dL (ref 6.4–8.3)

## 2017-03-05 LAB — CBC & DIFF AND RETIC
BASO%: 1.5 % (ref 0.0–2.0)
Basophils Absolute: 0.1 10*3/uL (ref 0.0–0.1)
EOS ABS: 0.2 10*3/uL (ref 0.0–0.5)
EOS%: 4.4 % (ref 0.0–7.0)
HCT: 28.6 % — ABNORMAL LOW (ref 34.8–46.6)
HEMOGLOBIN: 8.2 g/dL — AB (ref 11.6–15.9)
IMMATURE RETIC FRACT: 14.6 % — AB (ref 1.60–10.00)
LYMPH%: 26.5 % (ref 14.0–49.7)
MCH: 20.8 pg — ABNORMAL LOW (ref 25.1–34.0)
MCHC: 28.7 g/dL — ABNORMAL LOW (ref 31.5–36.0)
MCV: 72.6 fL — AB (ref 79.5–101.0)
MONO#: 0.5 10*3/uL (ref 0.1–0.9)
MONO%: 11.8 % (ref 0.0–14.0)
NEUT#: 2.6 10*3/uL (ref 1.5–6.5)
NEUT%: 55.8 % (ref 38.4–76.8)
NRBC: 0 % (ref 0–0)
Platelets: 326 10*3/uL (ref 145–400)
RBC: 3.94 10*6/uL (ref 3.70–5.45)
RDW: 24.4 % — AB (ref 11.2–14.5)
RETIC %: 2.43 % — AB (ref 0.70–2.10)
Retic Ct Abs: 95.74 10*3/uL — ABNORMAL HIGH (ref 33.70–90.70)
WBC: 4.6 10*3/uL (ref 3.9–10.3)
lymph#: 1.2 10*3/uL (ref 0.9–3.3)

## 2017-03-05 LAB — IRON AND TIBC
%SAT: 4 % — AB (ref 21–57)
IRON: 14 ug/dL — AB (ref 41–142)
TIBC: 389 ug/dL (ref 236–444)
UIBC: 374 ug/dL (ref 120–384)

## 2017-03-05 LAB — FERRITIN: FERRITIN: 10 ng/mL (ref 9–269)

## 2017-03-06 ENCOUNTER — Encounter: Payer: Self-pay | Admitting: Hematology and Oncology

## 2017-03-06 ENCOUNTER — Ambulatory Visit (HOSPITAL_BASED_OUTPATIENT_CLINIC_OR_DEPARTMENT_OTHER): Payer: BLUE CROSS/BLUE SHIELD | Admitting: Hematology and Oncology

## 2017-03-06 ENCOUNTER — Telehealth: Payer: Self-pay | Admitting: Hematology and Oncology

## 2017-03-06 VITALS — BP 133/87 | HR 83 | Temp 98.7°F | Resp 18 | Ht 63.0 in | Wt 243.2 lb

## 2017-03-06 DIAGNOSIS — R7989 Other specified abnormal findings of blood chemistry: Secondary | ICD-10-CM

## 2017-03-06 DIAGNOSIS — R946 Abnormal results of thyroid function studies: Secondary | ICD-10-CM | POA: Diagnosis not present

## 2017-03-06 DIAGNOSIS — D500 Iron deficiency anemia secondary to blood loss (chronic): Secondary | ICD-10-CM | POA: Diagnosis not present

## 2017-03-06 NOTE — Telephone Encounter (Signed)
Gave avs and calendar for October appoinment

## 2017-03-06 NOTE — Patient Instructions (Signed)
Thank you for choosing [facility] to provide your oncology and hematology care.  To afford each patient quality time with our providers, please arrive 30 minutes before your scheduled appointment time.  If you arrive late for your appointment, you may be asked to reschedule.  We strive to give you quality time with our providers, and arriving late affects you and other patients whose appointments are after yours.   If you are a no show for multiple scheduled visits, you may be dismissed from the clinic at the providers discretion.    Again, thank you for choosing [facility], our hope is that these requests will decrease the amount of time that you wait before being seen by our physicians.  ______________________________________________________________________  Should you have questions after your visit to the [facility], please contact our office at [phone] between the hours of 8:30 and 4:30 p.m.    Voicemails left after 4:30p.m will not be returned until the following business day.    For prescription refill requests, please have your pharmacy contact us directly.  Please also try to allow 48 hours for prescription requests.    Please contact the scheduling department for questions regarding scheduling.  For scheduling of procedures such as PET scans, CT scans, MRI, Ultrasound, etc please contact central scheduling at [phone].    Resources For Cancer Patients and Caregivers:   Oncolink.org:  A wonderful resource for patients and healthcare providers for information regarding your disease, ways to tract your treatment, what to expect, etc.     American Cancer Society:  800-227-2345  Can help patients locate various types of support and financial assistance  Cancer Care: 1-800-813-HOPE (4673) Provides financial assistance, online support groups, medication/co-pay assistance.    [place] DSS:  336-641-3447 Where to apply for food  stamps, Medicaid, and utility assistance  Medicare Rights Center: 800-333-4114 Helps people with Medicare understand their rights and benefits, navigate the Medicare system, and secure the quality healthcare they deserve  SCAT: 336-333-6589 [place] [employer]'s shared-ride transportation service for eligible riders who have a disability that prevents them from riding the fixed route bus.    For additional information on assistance programs please contact our social worker:   Grier Hock/Abigail Elmore:  [phone]            

## 2017-03-11 NOTE — Progress Notes (Signed)
[facility] Visit:  Assessment: Iron deficiency anemia due to chronic blood loss 41 year old female history of anemia and more recently with heavy menstrual periods precipitating severe anemia with iron depletion due to chronic blood loss. Patient has not been compliant with iron replacement due to poor tolerance of the ferrous sulfate. Current treatment has been replaced with ferrous gluconate at the last visit to the clinic. Patient reports significantly better tolerance of the medication and she has been able to take it daily twice a day without skipping too many days. In the interim, she was also evaluated by gynecology and by endocrinology. 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  Interval laboratory demonstrates gradually improving hemoglobin, patient is currently in the moderate anemia range with improving MCV, MCH, and slowly rising iron stores. The indicates effectiveness of iron replacement at this point in time my recommendation would be for the patient to continue her current iron supplement.  Plan:  --Continue ferrous gluconate, increase the dose to 3 times daily --Continue management of hypothyroidism per endocrinology --Return to clinic in 1 month for lab check  Voice recognition software was used and creation of this note. Despite my best effort at editing the text, some misspelling/errors may have occurred.  Orders Placed This Encounter  Procedures  . CBC & Diff and Retic    Standing Status:   Future    Standing Expiration Date:   03/06/2018  . Iron and TIBC    Standing Status:   Future    Standing Expiration Date:   03/06/2018  . Ferritin    Standing Status:   Future    Standing Expiration Date:   03/06/2018  . Comprehensive metabolic panel    Standing Status:   Future    Standing Expiration Date:    03/06/2018   All questions were answered.  . The patient knows to call the clinic with any problems, questions or concerns.  This note was electronically signed.    History of Presenting Illness Carrie [name] is a 41 y.o. female followed in the [facility] for iron deficiency anemia due to chronic blood loss. Patient has a history of chronic anemia for a number of years. She has been pregnant 4 times, last pregnancy 21 years ago. Over the past couple months, patient has been experiencing pelvic pain with bilateral intermittent sharp discomfort in the abdomen. Increased the cycles and no increase of the pain with intercourse. She has been experiencing heavy menstrual periods and has been evaluated by gynecology for the problem. She recently had a transvaginal ultrasound which demonstrated a possible polyp in the cervix or uterine body and patient is planned for treatment for that. In the past, she was supposed to be undergoing iron therapy, but she has not been compliant with prescriptions and only started the treatment Wednesday of last week. Main Barrett to compliance has been poor tolerance of the iron supplementation with nausea and constipation with ferrous sulfate.  At the last visit to the clinic, patient was transitioned to oral ferrous gluconate has been tolerating it significantly better, taking 2 tablets per day. Denies any fevers, chills or night sweats. No active abdominal pain, nausea, vomiting, constipation, or diarrhea at this time.   in the interim, patient was evaluated by gynecology and had uterine biopsy which returned back benign, patient also has been seen by endocrinology and was  started on levothyroxine 112 g daily.  Oncological/hematological History: --Labs, 01/22/17: WBC 4.5, Hgb 7.1, MCV 61.7, MCH 18.3, RDW 18.4, Plt 369; Fe 11, FeSat 3%, TIBC 429, Ferritin 3; TSH 9.74 --Labs, 03/05/17: WBC 4.6, Hgb 8.2, MCV 72.6, MCH 20.8, RDW 24.4, Plt 326; Fe 14, FeSat 4%, TIBC  389, Ferritin 10;   Medical History: History reviewed. No pertinent past medical history.  Surgical History: Past Surgical History:  Procedure Laterality Date  . BREAST CYST EXCISION Right     Family History: History reviewed. No pertinent family history.  Social History: Social History   Social History  . Marital status: Single    Spouse name: N/A  . Number of children: N/A  . Years of education: N/A   Occupational History  . Not on file.   Social History Main Topics  . Smoking status: Never Smoker  . Smokeless tobacco: Never Used  . Alcohol use No  . Drug use: No  . Sexual activity: Not on file   Other Topics Concern  . Not on file   Social History Narrative  . No narrative on file    Allergies: No Known Allergies  Medications:  Current Outpatient Prescriptions  Medication Sig Dispense Refill  . cetirizine (ZYRTEC) 10 MG tablet Take 10 mg by mouth daily.    . ferrous gluconate (FERGON) 324 MG tablet Take 1 tablet (324 mg total) by mouth 3 (three) times daily with meals. 90 tablet 0  . hydrochlorothiazide (HYDRODIURIL) 25 MG tablet Take 25 mg by mouth daily.    Marland Kitchen ibuprofen (ADVIL,MOTRIN) 800 MG tablet Take 800 mg by mouth every 8 (eight) hours as needed for mild pain.    Marland Kitchen levothyroxine (SYNTHROID, LEVOTHROID) 112 MCG tablet Take 112 mcg by mouth daily before breakfast.     No current facility-administered medications for this visit.     Review of Systems: Review of Systems  All other systems reviewed and are negative.    PHYSICAL EXAMINATION Blood pressure 133/87, pulse 83, temperature 98.7 F (37.1 C), temperature source Oral, resp. rate 18, height '5\' 3"'$  (1.6 m), weight 243 lb 3.2 oz (110.3 kg), SpO2 99 %.  ECOG PERFORMANCE STATUS: 1 - Symptomatic but completely ambulatory  Physical Exam  Constitutional: She is oriented to person, place, and time and well-developed, well-nourished, and in no distress.  HENT:  Head: Normocephalic and atraumatic.   Mouth/Throat: Oropharynx is clear and moist. No oropharyngeal exudate.  Eyes: Pupils are equal, round, and reactive to light. Conjunctivae and EOM are normal. No scleral icterus.  Neck: Normal range of motion. Neck supple. No thyromegaly present.  Cardiovascular: Normal rate, regular rhythm and normal heart sounds.   No murmur heard. Pulmonary/Chest: Effort normal and breath sounds normal. No respiratory distress. She has no wheezes. She has no rales.  Abdominal: Soft. Bowel sounds are normal. She exhibits no distension and no mass. There is no tenderness.  Musculoskeletal: Normal range of motion. She exhibits no edema.  Lymphadenopathy:    She has no cervical adenopathy.  Neurological: She is alert and oriented to person, place, and time. She has normal reflexes.  Skin: She is not diaphoretic.     LABORATORY DATA: I have personally reviewed the data as listed: Appointment on 03/05/2017  Component Date Value Ref Range Status  . WBC 03/05/2017 4.6  3.9 - 10.3 10e3/uL Final  . NEUT# 03/05/2017 2.6  1.5 - 6.5 10e3/uL Final  . HGB 03/05/2017 8.2* 11.6 - 15.9 g/dL Final  . HCT 03/05/2017 28.6*  34.8 - 46.6 % Final  . Platelets 03/05/2017 326  145 - 400 10e3/uL Final  . MCV 03/05/2017 72.6* 79.5 - 101.0 fL Final  . MCH 03/05/2017 20.8* 25.1 - 34.0 pg Final  . MCHC 03/05/2017 28.7* 31.5 - 36.0 g/dL Final  . RBC 03/05/2017 3.94  3.70 - 5.45 10e6/uL Final  . RDW 03/05/2017 24.4* 11.2 - 14.5 % Final  . lymph# 03/05/2017 1.2  0.9 - 3.3 10e3/uL Final  . MONO# 03/05/2017 0.5  0.1 - 0.9 10e3/uL Final  . Eosinophils Absolute 03/05/2017 0.2  0.0 - 0.5 10e3/uL Final  . Basophils Absolute 03/05/2017 0.1  0.0 - 0.1 10e3/uL Final  . NEUT% 03/05/2017 55.8  38.4 - 76.8 % Final  . LYMPH% 03/05/2017 26.5  14.0 - 49.7 % Final  . MONO% 03/05/2017 11.8  0.0 - 14.0 % Final  . EOS% 03/05/2017 4.4  0.0 - 7.0 % Final  . BASO% 03/05/2017 1.5  0.0 - 2.0 % Final  . nRBC 03/05/2017 0  0 - 0 % Final  . Retic %  03/05/2017 2.43* 0.70 - 2.10 % Final  . Retic Ct Abs 03/05/2017 95.74* 33.70 - 90.70 10e3/uL Final  . Immature Retic Fract 03/05/2017 14.60* 1.60 - 10.00 % Final  . Ferritin 03/05/2017 10  9 - 269 ng/ml Final  . Iron 03/05/2017 14* 41 - 142 ug/dL Final  . TIBC 03/05/2017 389  236 - 444 ug/dL Final  . UIBC 03/05/2017 374  120 - 384 ug/dL Final  . %SAT 03/05/2017 4* 21 - 57 % Final  . Sodium 03/05/2017 138  136 - 145 mEq/L Final  . Potassium 03/05/2017 3.6  3.5 - 5.1 mEq/L Final  . Chloride 03/05/2017 105  98 - 109 mEq/L Final  . CO2 03/05/2017 26  22 - 29 mEq/L Final  . Glucose 03/05/2017 105  70 - 140 mg/dl Final   Glucose reference range is for nonfasting patients. Fasting glucose reference range is 70- 100.  Marland Kitchen BUN 03/05/2017 6.6* 7.0 - 26.0 mg/dL Final  . Creatinine 03/05/2017 0.8  0.6 - 1.1 mg/dL Final  . Total Bilirubin 03/05/2017 0.35  0.20 - 1.20 mg/dL Final  . Alkaline Phosphatase 03/05/2017 49  40 - 150 U/L Final  . AST 03/05/2017 12  5 - 34 U/L Final  . ALT 03/05/2017 8  0 - 55 U/L Final  . Total Protein 03/05/2017 6.8  6.4 - 8.3 g/dL Final  . Albumin 03/05/2017 3.4* 3.5 - 5.0 g/dL Final  . Calcium 03/05/2017 9.0  8.4 - 10.4 mg/dL Final  . Anion Gap 03/05/2017 6  3 - 11 mEq/L Final  . EGFR 03/05/2017 >90  >90 ml/min/1.73 m2 Final   eGFR is calculated using the CKD-EPI Creatinine Equation (2009)       Ardath Sax, MD

## 2017-03-11 NOTE — Assessment & Plan Note (Signed)
41 year old female history of anemia and more recently with heavy menstrual periods precipitating severe anemia with iron depletion due to chronic blood loss. Patient has not been compliant with iron replacement due to poor tolerance of the ferrous sulfate. Current treatment has been replaced with ferrous gluconate at the last visit to the clinic. Patient reports significantly better tolerance of the medication and she has been able to take it daily twice a day without skipping too many days. In the interim, she was also evaluated by gynecology and by endocrinology. 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  Interval laboratory demonstrates gradually improving hemoglobin, patient is currently in the moderate anemia range with improving MCV, MCH, and slowly rising iron stores. The indicates effectiveness of iron replacement at this point in time my recommendation would be for the patient to continue her current iron supplement.  Plan:  --Continue ferrous gluconate, increase the dose to 3 times daily --Continue management of hypothyroidism per endocrinology --Return to clinic in 1 month for lab check

## 2017-04-09 ENCOUNTER — Ambulatory Visit: Payer: BLUE CROSS/BLUE SHIELD | Admitting: Hematology and Oncology

## 2017-05-10 ENCOUNTER — Ambulatory Visit (HOSPITAL_COMMUNITY)
Admission: EM | Admit: 2017-05-10 | Discharge: 2017-05-10 | Disposition: A | Discharge status: Home / Self Care | Payer: BLUE CROSS/BLUE SHIELD | Attending: Family Medicine | Admitting: Family Medicine

## 2017-05-10 ENCOUNTER — Other Ambulatory Visit: Payer: Self-pay

## 2017-05-10 ENCOUNTER — Ambulatory Visit (INDEPENDENT_AMBULATORY_CARE_PROVIDER_SITE_OTHER): Payer: BLUE CROSS/BLUE SHIELD

## 2017-05-10 ENCOUNTER — Emergency Department (HOSPITAL_COMMUNITY)
Admission: EM | Admit: 2017-05-10 | Discharge: 2017-05-10 | Disposition: A | Discharge status: Home / Self Care | Payer: BLUE CROSS/BLUE SHIELD | Attending: Emergency Medicine | Admitting: Emergency Medicine

## 2017-05-10 ENCOUNTER — Emergency Department (HOSPITAL_BASED_OUTPATIENT_CLINIC_OR_DEPARTMENT_OTHER): Payer: BLUE CROSS/BLUE SHIELD

## 2017-05-10 ENCOUNTER — Encounter (HOSPITAL_COMMUNITY): Payer: Self-pay | Admitting: Emergency Medicine

## 2017-05-10 ENCOUNTER — Encounter (HOSPITAL_COMMUNITY): Payer: Self-pay | Admitting: Family Medicine

## 2017-05-10 DIAGNOSIS — M25562 Pain in left knee: Secondary | ICD-10-CM | POA: Diagnosis not present

## 2017-05-10 DIAGNOSIS — M25462 Effusion, left knee: Secondary | ICD-10-CM

## 2017-05-10 DIAGNOSIS — M7989 Other specified soft tissue disorders: Secondary | ICD-10-CM

## 2017-05-10 DIAGNOSIS — Z79899 Other long term (current) drug therapy: Secondary | ICD-10-CM | POA: Diagnosis not present

## 2017-05-10 DIAGNOSIS — I10 Essential (primary) hypertension: Secondary | ICD-10-CM | POA: Insufficient documentation

## 2017-05-10 HISTORY — DX: Essential (primary) hypertension: I10

## 2017-05-10 HISTORY — DX: Disorder of thyroid, unspecified: E07.9

## 2017-05-10 MED ORDER — IBUPROFEN 400 MG PO TABS: 400.0000 mg | ORAL_TABLET | Freq: Three times a day (TID) | ORAL | 0 refills | Status: AC

## 2017-05-10 NOTE — ED Provider Notes (Signed)
[facility]   CSN: [id number] Arrival date & time: 05/10/17  1719     History   Chief Complaint Chief Complaint  Patient presents with  . Knee Pain    HPI Carrie [name] is a 41 y.o. female.  HPI  Patient presents with concern of knee pain. Onset was yesterday, with no trauma, no fall, no twisting. Since yesterday she has had full sensation throughout the knee, with tight sensation posteriorly and sharp pain in the medial aspect of the left knee.  No distal loss of sensation or weakness. No other complaints including no chest pain, no dyspnea. No medication taken for pain relief. With worsening pain over the interval 24 hours she went to urgent care, and was sent here after evaluation there, including x-ray, was unremarkable.   Past Medical History:  Diagnosis Date  . Hypertension   . Thyroid disease     Patient Active Problem List   Diagnosis Date Noted  . Iron deficiency anemia due to chronic blood loss 02/06/2017  . Elevated TSH 02/06/2017    Past Surgical History:  Procedure Laterality Date  . BREAST CYST EXCISION Right     OB History    No data available       Home Medications    Prior to Admission medications   Medication Sig Start Date End Date Taking? Authorizing Provider  cetirizine (ZYRTEC) 10 MG tablet Take 10 mg by mouth daily.    [provider]  ferrous gluconate (FERGON) 324 MG tablet Take 1 tablet (324 mg total) by mouth 3 (three) times daily with meals. 02/05/17 03/07/17  Daisy BlossomPerlov, Mikhail G, MD  hydrochlorothiazide (HYDRODIURIL) 25 MG tablet Take 25 mg by mouth daily.    [provider]  ibuprofen (ADVIL,MOTRIN) 800 MG tablet Take 800 mg by mouth every 8 (eight) hours as needed for mild pain.    [provider]  levothyroxine (SYNTHROID, LEVOTHROID) 112 MCG tablet Take 112 mcg by mouth daily before breakfast.    [provider]    Family History No  family history on file.  Social History Social History   Tobacco Use  . Smoking status: Never Smoker  . Smokeless tobacco: Never Used  Substance Use Topics  . Alcohol use: No  . Drug use: No     Allergies   Patient has no known allergies.   Review of Systems Review of Systems  Constitutional:       Per HPI, otherwise negative  HENT:       Per HPI, otherwise negative  Respiratory:       Per HPI, otherwise negative  Cardiovascular:       Per HPI, otherwise negative  Gastrointestinal: Negative for vomiting.  Endocrine:       Negative aside from HPI  Genitourinary:       Neg aside from HPI   Musculoskeletal:       Per HPI, otherwise negative  Skin: Negative.   Neurological: Negative for syncope.     Physical Exam Updated Vital Signs BP (!) 149/83   Pulse 74   Temp 98.6 F (37 C)   Resp 18   Ht 5\' 3"  (1.6 m)   Wt 108 kg (238 lb)   LMP 04/30/2017 (Exact Date)   SpO2 100%   BMI 42.16 kg/m   Physical Exam  Constitutional: She is oriented to person, place, and time. She appears well-developed and well-nourished. No distress.  Obese female sitting upright, awake,  alert.  HENT:  Head: Normocephalic and atraumatic.  Eyes: Conjunctivae and EOM are normal.  Cardiovascular: Normal rate and regular rhythm.  Pulmonary/Chest: Effort normal and breath sounds normal. No stridor. No respiratory distress.  Abdominal: She exhibits no distension.  Musculoskeletal: She exhibits no edema.       Right knee: Normal.       Left knee: She exhibits decreased range of motion and swelling. She exhibits no deformity, no laceration, no erythema, normal alignment, no LCL laxity, normal patellar mobility, no bony tenderness, normal meniscus and no MCL laxity. Tenderness found. Medial joint line tenderness noted.       Left ankle: Normal.  Neurological: She is alert and oriented to person, place, and time. No cranial nerve deficit.  Skin: Skin is warm and dry.  Psychiatric: She has a  normal mood and affect.  Nursing note and vitals reviewed.    ED Treatments / Results   Radiology Dg Knee Complete 4 Views Left  Result Date: 05/10/2017 CLINICAL DATA:  Left knee pain for 3 days.  No known injury. EXAM: LEFT KNEE - COMPLETE 4+ VIEW COMPARISON:  None. FINDINGS: The joint spaces are maintained. No acute fracture or osteochondral lesion. No erosive findings or chondrocalcinosis. Possible joint effusion. IMPRESSION: No acute bony findings or significant degenerative changes. Electronically Signed   By: Rudie MeyerP.  Gallerani M.D.   On: 05/10/2017 16:55    Procedures Procedures (including critical care time) Duplex ultrasound performed. I discussed the findings with our ultrasonographer. No evidence for DVT. There is evidence for a Baker's cyst. Initial Impression / Assessment and Plan / ED Course  I have reviewed the triage vital signs and the nursing notes.  Pertinent labs & imaging results that were available during my care of the patient were reviewed by me and considered in my medical decision making (see chart for details).    After the initial evaluation I reviewed the patient's chart including document patient from urgent care just prior to ED arrival. On repeat exam patient is in no distress per We discussed all findings including evidence for Baker's cyst. Discussed the importance of following up with orthopedics, use of anti-inflammatories, rest, ice, and return precautions as well as home care instructions. With no evidence for DVT, cellulitis or other acute new pathology, the patient is appropriate for discharge. Final Clinical Impressions(s) / ED Diagnoses   Final diagnoses:  Acute pain of left knee    ED Discharge Orders        Ordered    ibuprofen (ADVIL,MOTRIN) 400 MG tablet  3 times daily     05/10/17 1911       Gerhard MunchLockwood, Reham Slabaugh, MD 05/10/17 1913

## 2017-05-10 NOTE — Progress Notes (Signed)
VASCULAR LAB PRELIMINARY  PRELIMINARY  PRELIMINARY  PRELIMINARY  Left lower extremity venous duplex completed.    Preliminary report:  There is no DVT or SVT noted in the left lower extremity.  There is an intact Baker's cyst measuring 8mm X 10 mm in the left popliteal fossa.  Gave report to Dr. Havery MorosLockwood  Darnise Montag, [facility] System[facility]ANDACE, RVT 05/10/2017, 6:48 PM

## 2017-05-10 NOTE — ED Provider Notes (Signed)
[facility]    CSN: [id number] Arrival date & time: 05/10/17  1600     History   Chief Complaint Chief Complaint  Patient presents with  . Knee Pain    HPI Richrd PrimeDanita R [name] is a 41 y.o. female.   HPI Richrd PrimeDanita R [name] is a 41 y.o. female presenting to UC with c/o Left knee pain that started yesterday, pain and swelling to the back of her knee and pain to the medial aspect of her knee. Denies redness or warmth to her leg.  No known injury.  She has tried Aspirin and tylenol w/o much relief.  Pt does stand a lot for her job.  No hx of blood clots in the past. Denies chest pain or SOB.  No family hx of blood clots.  No recent travel or surgeries.    Past Medical History:  Diagnosis Date  . Hypertension   . Thyroid disease     Patient Active Problem List   Diagnosis Date Noted  . Iron deficiency anemia due to chronic blood loss 02/06/2017  . Elevated TSH 02/06/2017    Past Surgical History:  Procedure Laterality Date  . BREAST CYST EXCISION Right     OB History    No data available       Home Medications    Prior to Admission medications   Medication Sig Start Date End Date Taking? Authorizing Provider  cetirizine (ZYRTEC) 10 MG tablet Take 10 mg by mouth daily.    [provider]  ferrous gluconate (FERGON) 324 MG tablet Take 1 tablet (324 mg total) by mouth 3 (three) times daily with meals. 02/05/17 03/07/17  Daisy BlossomPerlov, Mikhail G, MD  hydrochlorothiazide (HYDRODIURIL) 25 MG tablet Take 25 mg by mouth daily.    [provider]  ibuprofen (ADVIL,MOTRIN) 800 MG tablet Take 800 mg by mouth every 8 (eight) hours as needed for mild pain.    [provider]  levothyroxine (SYNTHROID, LEVOTHROID) 112 MCG tablet Take 112 mcg by mouth daily before breakfast.    [provider]    Family History History reviewed. No pertinent family history.  Social History Social History   Tobacco Use  . Smoking status: Never Smoker  .  Smokeless tobacco: Never Used  Substance Use Topics  . Alcohol use: No  . Drug use: No     Allergies   Patient has no known allergies.   Review of Systems Review of Systems  Constitutional: Negative for chills and fever.  Musculoskeletal: Positive for arthralgias, joint swelling and myalgias. Negative for back pain and gait problem.  Skin: Negative for color change and wound.  Neurological: Negative for weakness and numbness.     Physical Exam Triage Vital Signs ED Triage Vitals  Enc Vitals Group     BP 05/10/17 1612 135/82     Pulse Rate 05/10/17 1612 72     Resp 05/10/17 1612 18     Temp 05/10/17 1612 98.4 F (36.9 C)     Temp src --      SpO2 05/10/17 1612 100 %     Weight --      Height --      Head Circumference --      Peak Flow --      Pain Score 05/10/17 1611 7     Pain Loc --      Pain Edu? --      Excl. in GC? --    No data found.  Updated Vital  Signs BP 135/82   Pulse 72   Temp 98.4 F (36.9 C)   Resp 18   LMP 04/30/2017 (Exact Date)   SpO2 100%   Visual Acuity Right Eye Distance:   Left Eye Distance:   Bilateral Distance:    Right Eye Near:   Left Eye Near:    Bilateral Near:     Physical Exam  Constitutional: She is oriented to person, place, and time. She appears well-developed and well-nourished. No distress.  HENT:  Head: Normocephalic and atraumatic.  Eyes: EOM are normal.  Neck: Normal range of motion.  Cardiovascular: Normal rate.  Pulmonary/Chest: Effort normal.  Musculoskeletal: Normal range of motion. She exhibits edema and tenderness.  Left knee: mild edema compared to Right, tenderness to posterior and medial aspect. Decreased flexion due to pain. Mild tenderness to upper calf. Soft muscle compartments.   Neurological: She is alert and oriented to person, place, and time.  Skin: Skin is warm and dry. She is not diaphoretic. No erythema.  Left knee: skin in tact. No ecchymosis or erythema.   Psychiatric: She has a normal  mood and affect. Her behavior is normal.  Nursing note and vitals reviewed.    UC Treatments / Results  Labs (all labs ordered are listed, but only abnormal results are displayed) Labs Reviewed - No data to display  EKG  EKG Interpretation None       Radiology Dg Knee Complete 4 Views Left  Result Date: 05/10/2017 CLINICAL DATA:  Left knee pain for 3 days.  No known injury. EXAM: LEFT KNEE - COMPLETE 4+ VIEW COMPARISON:  None. FINDINGS: The joint spaces are maintained. No acute fracture or osteochondral lesion. No erosive findings or chondrocalcinosis. Possible joint effusion. IMPRESSION: No acute bony findings or significant degenerative changes. Electronically Signed   By: Rudie MeyerP.  Gallerani M.D.   On: 05/10/2017 16:55    Procedures Procedures (including critical care time)  Medications Ordered in UC Medications - No data to display   Initial Impression / Assessment and Plan / UC Course  I have reviewed the triage vital signs and the nursing notes.  Pertinent labs & imaging results that were available during my care of the patient were reviewed by me and considered in my medical decision making (see chart for details).     Pt c/o Left knee pain and swelling since yesterday w/o known injury.  Pain and swelling worse in the back of her Left knee. Pt is obese, however, mild edema compared to Right knee and tenderness noted on exam. No erythema or warmth.   Plain films: Normal, no joint effusion or acute bony abnormality.  Recommended pt go to ED for ultrasound to r/o DVT Pt agreeable   Final Clinical Impressions(s) / UC Diagnoses   Final diagnoses:  Pain and swelling of left knee  Posterior knee pain, left    ED Discharge Orders    None       Controlled Substance Prescriptions [place] Controlled Substance Registry consulted? Not Applicable   Rolla Platehelps, Briel Gallicchio O, PA-C 05/10/17 [id number]

## 2017-05-10 NOTE — ED Triage Notes (Signed)
Pt states left knee pain and swelling since Friday. No injury. Xrays performed at urgent care. They sent her here for DVT study.

## 2017-05-10 NOTE — ED Notes (Signed)
Pt was adv to f/u w/[facility] for further eval.... Adv NPO .Marland Kitchen.Marland Kitchen. Pt verb understanding.

## 2017-05-10 NOTE — Discharge Instructions (Signed)
As discussed, your knee pain may be due at least in part to the presence of a Bakers cyst.    Today's findings otherwise reassuring, but it is important he follow-up with our orthopedic colleagues for further evaluation and management. Return here for concerning changes in your condition.

## 2017-05-10 NOTE — ED Notes (Signed)
Patient transported to Ultrasound 

## 2017-05-10 NOTE — ED Triage Notes (Signed)
Pt here for left knee pain and swelling since Saturday. Denies injury.

## 2017-07-30 ENCOUNTER — Ambulatory Visit: Admit: 2017-07-30 | Payer: BLUE CROSS/BLUE SHIELD | Admitting: Obstetrics and Gynecology

## 2017-07-30 SURGERY — DILATATION & CURETTAGE/HYSTEROSCOPY WITH MYOSURE
Anesthesia: Choice

## 2017-08-06 ENCOUNTER — Other Ambulatory Visit: Payer: Self-pay | Admitting: Endocrinology

## 2017-08-06 DIAGNOSIS — E049 Nontoxic goiter, unspecified: Secondary | ICD-10-CM

## 2017-08-13 ENCOUNTER — Ambulatory Visit
Admission: RE | Admit: 2017-08-13 | Discharge: 2017-08-13 | Disposition: A | Discharge status: Home / Self Care | Payer: BLUE CROSS/BLUE SHIELD | Source: Ambulatory Visit | Attending: Endocrinology | Admitting: Endocrinology

## 2017-08-13 DIAGNOSIS — E049 Nontoxic goiter, unspecified: Secondary | ICD-10-CM

## 2017-10-15 ENCOUNTER — Other Ambulatory Visit: Payer: Self-pay | Admitting: Primary Care

## 2017-10-15 DIAGNOSIS — Z139 Encounter for screening, unspecified: Secondary | ICD-10-CM

## 2017-11-12 ENCOUNTER — Ambulatory Visit
Admission: RE | Admit: 2017-11-12 | Discharge: 2017-11-12 | Disposition: A | Discharge status: Home / Self Care | Payer: BLUE CROSS/BLUE SHIELD | Source: Ambulatory Visit | Attending: Primary Care | Admitting: Primary Care

## 2017-11-12 DIAGNOSIS — Z139 Encounter for screening, unspecified: Secondary | ICD-10-CM

## 2017-12-09 ENCOUNTER — Other Ambulatory Visit: Payer: Self-pay | Admitting: Hematology and Oncology

## 2017-12-17 ENCOUNTER — Telehealth: Payer: Self-pay

## 2017-12-17 NOTE — Telephone Encounter (Signed)
Called patient to make her aware that office received a refill request for Ferrous Gluconate pill. Per Dr. Candise CheKale, since patient has not been seen here in over nine months she should follow up with her PCP and if PCP deems it necessary, patient [facility]. Called patient and made her aware and she verbalized understanding.

## 2018-03-17 ENCOUNTER — Encounter: Payer: Self-pay | Admitting: Hematology

## 2018-03-17 ENCOUNTER — Telehealth: Payer: Self-pay | Admitting: Hematology

## 2018-03-17 NOTE — Telephone Encounter (Signed)
New hematology referral received from Dr. Talmage NapBalan for IDA. Pt has been scheduled to see Dr. Candise CheKale on 10/10 at 10am. Pt aware to arrive 30 minutes early. Letter mailed. Ms. Leonor LivHolt is  Former pt of Dr. Gweneth DimitriPerlov.

## 2018-04-07 NOTE — Progress Notes (Signed)
HEMATOLOGY/ONCOLOGY CONSULTATION NOTE  Date of Service: 04/08/2018  Patient Care Team: Grayce Sessions, NP as PCP - General (Internal Medicine) Dr. Maxie Better as OBGYN   CHIEF COMPLAINTS/PURPOSE OF CONSULTATION:  Iron deficiency anemia   HISTORY OF PRESENTING ILLNESS:   Carrie [name] is a wonderful 42 y.o. female who has been previously seen by my colleague Dr. Milinda Antis for evaluation and management of Iron deficiency anemia. The pt reports that she is doing well overall.   The pt reports that she had a polyp removed since her last visit with my colleague Dr Gweneth Dimitri a year ago. She was having heavy periods for four days, and is now having periods that vary between light to heavy as opposed to always heavy. She notes that she is considering a hysterectomy next year with her OBGYN Dr. Cherly Hensen. The pt adds that she has been having strong crushed ice picca cravings. She has never received IV Iron replacement, but is currently taking OTC 325mg  Ferrous Sulfate three times a day for the past year. Interestingly, with a HGB of 6.6 she endorses good energy levels but does occasionally feel dizzy.   The pt also notes that she has been more SOB and was sent to a cardiologist. She sites that her mother died of an enlarged heart and early heart disease runs in her family. She is following cardiology and had a carotid US and is anticipating further work up. The pt also had a stress test last week and notes that this looked good.  She notes that her thyroid levels have normalized after beginning Levothyroxine in the last year.  The pt denies any dietary restrictions. She denies blood in the stools or any other concerns for blood loss including recent surgeries.  The pt notes that she has had episodes of hot flashes in the last 3 months as well.    Most recent lab results (03/04/18) of CBC w/diff is as follows: all values are WNL except for HGB at 6.6, HCT at 24.9, MCV at 64, MCH at  17.1, MCHC at 26.5, RDW at 20.4, PLT at 460k.  On review of systems, pt reports ice cravings, some SOB, good energy levels, some mild ankle swelling occasional dizziness, and denies unexpected weight loss, fevers, chills, blood in the stools, nose bleeds, and any other symptoms.   On PMHx the pt reports iron deficiency anemia. On Social Hx the pt denies ETOH consumption and denies ever smoking cigarettes. She works as a Engineer, production for [employer].  On Family Hx the pt reports mother died of enlarged heart, and early cardiovascular disease.  The pt denies any known drug allergies.   MEDICAL HISTORY:  Past Medical History:  Diagnosis Date  . Hypertension   . Thyroid disease     SURGICAL HISTORY: Past Surgical History:  Procedure Laterality Date  . BREAST CYST EXCISION Right     SOCIAL HISTORY: Social History   Socioeconomic History  . Marital status: Single    Spouse name: Not on file  . Number of children: Not on file  . Years of education: Not on file  . Highest education level: Not on file  Occupational History  . Not on file  Social Needs  . Financial resource strain: Not on file  . Food insecurity:    Worry: Not on file    Inability: Not on file  . Transportation needs:    Medical: Not on file    Non-medical: Not on file  Tobacco  Use  . Smoking status: Never Smoker  . Smokeless tobacco: Never Used  Substance and Sexual Activity  . Alcohol use: Yes    Comment: rare - social  . Drug use: No  . Sexual activity: Yes    Birth control/protection: None  Lifestyle  . Physical activity:    Days per week: Not on file    Minutes per session: Not on file  . Stress: Not on file  Relationships  . Social connections:    Talks on phone: Not on file    Gets together: Not on file    Attends religious service: Not on file    Active member of club or organization: Not on file    Attends meetings of clubs or organizations: Not on file    Relationship status: Not on file  .  Intimate partner violence:    Fear of current or ex partner: Not on file    Emotionally abused: Not on file    Physically abused: Not on file    Forced sexual activity: Not on file  Other Topics Concern  . Not on file  Social History Narrative  . Not on file    FAMILY HISTORY: Family History  Problem Relation Age of Onset  . Heart disease Mother   . Heart disease Cousin     ALLERGIES:  has No Known Allergies.  MEDICATIONS:  Current Outpatient Medications  Medication Sig Dispense Refill  . aspirin 325 MG tablet Take 325 mg daily as needed by mouth for moderate pain.    . cetirizine (ZYRTEC) 10 MG tablet Take 10 mg by mouth daily.    . ferrous gluconate (FERGON) 324 MG tablet Take 1 tablet (324 mg total) by mouth 3 (three) times daily with meals. 90 tablet 0  . hydrochlorothiazide (HYDRODIURIL) 25 MG tablet Take 25 mg by mouth daily.    Marland Kitchen levothyroxine (SYNTHROID, LEVOTHROID) 100 MCG tablet Take 100 mcg daily before breakfast by mouth.      No current facility-administered medications for this visit.     REVIEW OF SYSTEMS:    10 Point review of Systems was done is negative except as noted above.  PHYSICAL EXAMINATION:  . Vitals:   04/08/18 1028  BP: 125/81  Pulse: 71  Resp: 16  Temp: 98.5 F (36.9 C)  SpO2: 100%   Filed Weights   04/08/18 1028  Weight: 243 lb 3.2 oz (110.3 kg)   .Body mass index is 43.08 kg/m.  GENERAL:alert, in no acute distress and comfortable SKIN: no acute rashes, no significant lesions EYES: conjunctiva are pink and non-injected, sclera anicteric OROPHARYNX: MMM, no exudates, no oropharyngeal erythema or ulceration NECK: supple, no JVD LYMPH:  no palpable lymphadenopathy in the cervical, axillary or inguinal regions LUNGS: clear to auscultation b/l with normal respiratory effort HEART: regular rate & rhythm ABDOMEN:  normoactive bowel sounds , non tender, not distended. Extremity: 1+ pedal edema PSYCH: alert & oriented x 3 with  fluent speech NEURO: no focal motor/sensory deficits  LABORATORY DATA:  I have reviewed the data as listed  . CBC Latest Ref Rng & Units 04/08/2018 03/05/2017 02/05/2017  WBC 4.0 - 10.5 K/uL 5.2 4.6 4.7  Hemoglobin 12.0 - 15.0 g/dL 7.4(L) 8.2(L) 7.5(L)  Hematocrit 36.0 - 46.0 % 27.6(L) 28.6(L) 25.8(L)  Platelets 150 - 400 K/uL 334 326 302    . CMP Latest Ref Rng & Units 04/08/2018 03/05/2017 10/10/2009  Glucose 70 - 99 mg/dL 90 161 87  BUN 6 - 20 mg/dL  9 6.6(L) 6  Creatinine 0.44 - 1.00 mg/dL 1.61 0.8 0.96  Sodium 045 - 145 mmol/L 139 138 141  Potassium 3.5 - 5.1 mmol/L 3.8 3.6 4.3  Chloride 98 - 111 mmol/L 107 - 108  CO2 22 - 32 mmol/L 23 26 25   Calcium 8.9 - 10.3 mg/dL 9.1 9.0 8.4  Total Protein 6.5 - 8.1 g/dL 7.7 6.8 7.0  Total Bilirubin 0.3 - 1.2 mg/dL 0.4 4.09 0.4  Alkaline Phos 38 - 126 U/L 48 49 51  AST 15 - 41 U/L 12(L) 12 10  ALT 0 - 44 U/L 8 8 <8 U/L   . Lab Results  Component Value Date   IRON 14 (L) 04/08/2018   TIBC 433 04/08/2018   IRONPCTSAT 3 (L) 04/08/2018   (Iron and TIBC)  Lab Results  Component Value Date   FERRITIN <4 (L) 04/08/2018    03/04/18 CBC w/diff:    RADIOGRAPHIC STUDIES: I have personally reviewed the radiological images as listed and agreed with the findings in the report. No results found.  ASSESSMENT & PLAN:  42 y.o. female with  1. Iron deficiency anemia- due to menorrhagia  -Discussed patient's most recent labs from 03/04/18, HGB at 6.6, MCV at 64 -Continue follow up with Dr. Maxie Better for consideration of interventions to stop/improve menorrhagia  -Pt has been taking 325mg  Ferrous Sulfate PO three times a day for the last year, suggestive that the pt could benefit from IV Injectafer replacement -Also recommended that the pt use graded compression socks -Will order blood tests today  -Will set pt up for 2 doses of IV Injectafer, no known drug allergies -Begin taking Vitamin B complex and continue Vitamin B12 as well    -Asked that pt call me or present to the ED if she feels light headed or dizzy in the interim, which would be concerning for a blood transfusion  -Will see the pt back in 6 weeks, sooner if any new concerns    Labs today IV INjectafer weekly x 2 doses (Stat preferrably tommorrow -- patients hgb is 6.6) RTC with Dr Candise Che in 6 weeks with labs   All of the patients questions were answered with apparent satisfaction. The patient knows to call the clinic with any problems, questions or concerns.  The total time spent in the appt was 40 minutes and more than 50% was on counseling and direct patient cares.    Wyvonnia Lora MD MS AAHIVMS [facility] Hematology/Oncology Physician [facility]  (Office):       [phone] (Work cell):  [phone] (Fax):           [phone]  04/08/2018 11:21 AM  I, Marcelline Mates, am acting as a scribe for Dr. Candise Che  .I have reviewed the above documentation for accuracy and completeness, and I agree with the above. Johney Maine MD

## 2018-04-08 ENCOUNTER — Encounter: Payer: Self-pay | Admitting: Hematology

## 2018-04-08 ENCOUNTER — Inpatient Hospital Stay: Payer: BLUE CROSS/BLUE SHIELD | Attending: Hematology | Admitting: Hematology

## 2018-04-08 ENCOUNTER — Inpatient Hospital Stay: Payer: BLUE CROSS/BLUE SHIELD

## 2018-04-08 VITALS — BP 125/81 | HR 71 | Temp 98.5°F | Resp 16 | Ht 63.0 in | Wt 243.2 lb

## 2018-04-08 DIAGNOSIS — I10 Essential (primary) hypertension: Secondary | ICD-10-CM

## 2018-04-08 DIAGNOSIS — D500 Iron deficiency anemia secondary to blood loss (chronic): Secondary | ICD-10-CM | POA: Insufficient documentation

## 2018-04-08 DIAGNOSIS — N920 Excessive and frequent menstruation with regular cycle: Secondary | ICD-10-CM | POA: Diagnosis not present

## 2018-04-08 LAB — CMP (CANCER CENTER ONLY)
ALBUMIN: 3.9 g/dL (ref 3.5–5.0)
ALK PHOS: 48 U/L (ref 38–126)
ALT: 8 U/L (ref 0–44)
AST: 12 U/L — ABNORMAL LOW (ref 15–41)
Anion gap: 9 (ref 5–15)
BILIRUBIN TOTAL: 0.4 mg/dL (ref 0.3–1.2)
BUN: 9 mg/dL (ref 6–20)
CALCIUM: 9.1 mg/dL (ref 8.9–10.3)
CO2: 23 mmol/L (ref 22–32)
Chloride: 107 mmol/L (ref 98–111)
Creatinine: 0.8 mg/dL (ref 0.44–1.00)
GFR, Est AFR Am: >60 mL/min (ref >60)
GFR, Estimated: >60 mL/min (ref >60)
GLUCOSE: 90 mg/dL (ref 70–99)
POTASSIUM: 3.8 mmol/L (ref 3.5–5.1)
Sodium: 139 mmol/L (ref 135–145)
TOTAL PROTEIN: 7.7 g/dL (ref 6.5–8.1)

## 2018-04-08 LAB — CBC WITH DIFFERENTIAL/PLATELET
ABS IMMATURE GRANULOCYTES: 0.01 10*3/uL (ref 0.00–0.07)
BASOS PCT: 1 %
Basophils Absolute: 0.1 10*3/uL (ref 0.0–0.1)
Eosinophils Absolute: 0.2 10*3/uL (ref 0.0–0.5)
Eosinophils Relative: 4 %
HEMATOCRIT: 27.6 % — AB (ref 36.0–46.0)
HEMOGLOBIN: 7.4 g/dL — AB (ref 12.0–15.0)
Immature Granulocytes: 0 %
LYMPHS ABS: 1.5 10*3/uL (ref 0.7–4.0)
Lymphocytes Relative: 29 %
MCH: 18 pg — AB (ref 26.0–34.0)
MCHC: 26.8 g/dL — ABNORMAL LOW (ref 30.0–36.0)
MCV: 67 fL — AB (ref 80.0–100.0)
MONO ABS: 0.6 10*3/uL (ref 0.1–1.0)
Monocytes Relative: 11 %
NEUTROS ABS: 2.9 10*3/uL (ref 1.7–7.7)
Neutrophils Relative %: 55 %
PLATELETS: 334 10*3/uL (ref 150–400)
RBC: 4.12 MIL/uL (ref 3.87–5.11)
RDW: 21 % — ABNORMAL HIGH (ref 11.5–15.5)
WBC: 5.2 10*3/uL (ref 4.0–10.5)
nRBC: 0 % (ref 0.0–0.2)

## 2018-04-08 LAB — RETICULOCYTES
IMMATURE RETIC FRACT: 23.9 % — AB (ref 2.3–15.9)
RBC.: 4.12 MIL/uL (ref 3.87–5.11)
RETIC CT PCT: 1.6 % (ref 0.4–3.1)
Retic Count, Absolute: 64.7 10*3/uL (ref 19.0–186.0)

## 2018-04-08 LAB — VITAMIN B12: VITAMIN B 12: 354 pg/mL (ref 180–914)

## 2018-04-08 LAB — IRON AND TIBC
Iron: 14 ug/dL — ABNORMAL LOW (ref 41–142)
Saturation Ratios: 3 % — ABNORMAL LOW (ref 21–57)
TIBC: 433 ug/dL (ref 236–444)
UIBC: 419 ug/dL

## 2018-04-08 LAB — LACTATE DEHYDROGENASE: LDH: 160 U/L (ref 98–192)

## 2018-04-08 LAB — FERRITIN: Ferritin: <4 ng/mL — ABNORMAL LOW (ref 11–307)

## 2018-04-08 LAB — SAMPLE TO BLOOD BANK

## 2018-04-09 LAB — HAPTOGLOBIN: Haptoglobin: 121 mg/dL (ref 34–200)

## 2018-04-15 ENCOUNTER — Inpatient Hospital Stay: Payer: BLUE CROSS/BLUE SHIELD

## 2018-04-15 ENCOUNTER — Other Ambulatory Visit: Payer: Self-pay | Admitting: Hematology

## 2018-04-15 VITALS — BP 155/97 | HR 60 | Temp 99.0°F | Resp 18

## 2018-04-15 DIAGNOSIS — D500 Iron deficiency anemia secondary to blood loss (chronic): Secondary | ICD-10-CM

## 2018-04-15 MED ORDER — SODIUM CHLORIDE 0.9 % IV SOLN
Freq: Once | INTRAVENOUS | Status: AC
  Administered 2018-04-15: 16:00:00 via INTRAVENOUS
  Filled 2018-04-15: qty 250

## 2018-04-15 MED ORDER — SODIUM CHLORIDE 0.9 % IV SOLN
750.0000 mg | Freq: Once | INTRAVENOUS | Status: AC
  Administered 2018-04-15: 750 mg via INTRAVENOUS
  Filled 2018-04-15: qty 15

## 2018-04-15 NOTE — Patient Instructions (Signed)
Ferric carboxymaltose injection What is this medicine? FERRIC CARBOXYMALTOSE (ferr-ik car-box-ee-mol-toes) is an iron complex. Iron is used to make healthy red blood cells, which carry oxygen and nutrients throughout the body. This medicine is used to treat anemia in people with chronic kidney disease or people who cannot take iron by mouth. This medicine may be used for other purposes; ask your health care provider or pharmacist if you have questions. COMMON BRAND NAME(S): Injectafer What should I tell my health care provider before I take this medicine? They need to know if you have any of these conditions: -anemia not caused by low iron levels -high levels of iron in the blood -liver disease -an unusual or allergic reaction to iron, other medicines, foods, dyes, or preservatives -pregnant or trying to get pregnant -breast-feeding How should I use this medicine? This medicine is for infusion into a vein. It is given by a health care professional in a hospital or clinic setting. Talk to your pediatrician regarding the use of this medicine in children. Special care may be needed. Overdosage: If you think you have taken too much of this medicine contact a poison control center or emergency room at once. NOTE: This medicine is only for you. Do not share this medicine with others. What if I miss a dose? It is important not to miss your dose. Call your doctor or health care professional if you are unable to keep an appointment. What may interact with this medicine? Do not take this medicine with any of the following medications: -deferoxamine -dimercaprol -other iron products This medicine may also interact with the following medications: -chloramphenicol -deferasirox This list may not describe all possible interactions. Give your health care provider a list of all the medicines, herbs, non-prescription drugs, or dietary supplements you use. Also tell them if you smoke, drink alcohol, or use  illegal drugs. Some items may interact with your medicine. What should I watch for while using this medicine? Visit your doctor or health care professional regularly. Tell your doctor if your symptoms do not start to get better or if they get worse. You may need blood work done while you are taking this medicine. You may need to follow a special diet. Talk to your doctor. Foods that contain iron include: whole grains/cereals, dried fruits, beans, or peas, leafy green vegetables, and organ meats (liver, kidney). What side effects may I notice from receiving this medicine? Side effects that you should report to your doctor or health care professional as soon as possible: -allergic reactions like skin rash, itching or hives, swelling of the face, lips, or tongue -breathing problems -changes in blood pressure -feeling faint or lightheaded, falls -flushing, sweating, or hot feelings Side effects that usually do not require medical attention (report to your doctor or health care professional if they continue or are bothersome): -changes in taste -constipation -dizziness -headache -nausea -pain, redness, or irritation at site where injected -vomiting This list may not describe all possible side effects. Call your doctor for medical advice about side effects. You may report side effects to FDA at 1-800-FDA-1088. Where should I keep my medicine? This drug is given in a hospital or clinic and will not be stored at home. NOTE: This sheet is a summary. It may not cover all possible information. If you have questions about this medicine, talk to your doctor, pharmacist, or health care provider.  2018 Elsevier/Gold Standard (2015-07-19 11:20:47)  

## 2018-04-22 ENCOUNTER — Inpatient Hospital Stay: Payer: BLUE CROSS/BLUE SHIELD

## 2018-04-22 VITALS — BP 110/64 | HR 67 | Temp 98.1°F | Resp 18

## 2018-04-22 DIAGNOSIS — D500 Iron deficiency anemia secondary to blood loss (chronic): Secondary | ICD-10-CM | POA: Diagnosis not present

## 2018-04-22 MED ORDER — SODIUM CHLORIDE 0.9 % IV SOLN
Freq: Once | INTRAVENOUS | Status: AC
  Administered 2018-04-22: 10:00:00 via INTRAVENOUS
  Filled 2018-04-22: qty 250

## 2018-04-22 MED ORDER — FERRIC CARBOXYMALTOSE 750 MG/15ML IV SOLN
750.0000 mg | Freq: Once | INTRAVENOUS | Status: AC
  Administered 2018-04-22: 750 mg via INTRAVENOUS
  Filled 2018-04-22: qty 15

## 2018-04-22 NOTE — Patient Instructions (Addendum)
Ferric carboxymaltose injection What is this medicine? FERRIC CARBOXYMALTOSE (ferr-ik car-box-ee-mol-toes) is an iron complex. Iron is used to make healthy red blood cells, which carry oxygen and nutrients throughout the body. This medicine is used to treat anemia in people with chronic kidney disease or people who cannot take iron by mouth. This medicine may be used for other purposes; ask your health care provider or pharmacist if you have questions. COMMON BRAND NAME(S): Injectafer What should I tell my health care provider before I take this medicine? They need to know if you have any of these conditions: -anemia not caused by low iron levels -high levels of iron in the blood -liver disease -an unusual or allergic reaction to iron, other medicines, foods, dyes, or preservatives -pregnant or trying to get pregnant -breast-feeding How should I use this medicine? This medicine is for infusion into a vein. It is given by a health care professional in a hospital or clinic setting. Talk to your pediatrician regarding the use of this medicine in children. Special care may be needed. Overdosage: If you think you have taken too much of this medicine contact a poison control center or emergency room at once. NOTE: This medicine is only for you. Do not share this medicine with others. What if I miss a dose? It is important not to miss your dose. Call your doctor or health care professional if you are unable to keep an appointment. What may interact with this medicine? Do not take this medicine with any of the following medications: -deferoxamine -dimercaprol -other iron products This medicine may also interact with the following medications: -chloramphenicol -deferasirox This list may not describe all possible interactions. Give your health care provider a list of all the medicines, herbs, non-prescription drugs, or dietary supplements you use. Also tell them if you smoke, drink alcohol, or use  illegal drugs. Some items may interact with your medicine. What should I watch for while using this medicine? Visit your doctor or health care professional regularly. Tell your doctor if your symptoms do not start to get better or if they get worse. You may need blood work done while you are taking this medicine. You may need to follow a special diet. Talk to your doctor. Foods that contain iron include: whole grains/cereals, dried fruits, beans, or peas, leafy green vegetables, and organ meats (liver, kidney). What side effects may I notice from receiving this medicine? Side effects that you should report to your doctor or health care professional as soon as possible: -allergic reactions like skin rash, itching or hives, swelling of the face, lips, or tongue -breathing problems -changes in blood pressure -feeling faint or lightheaded, falls -flushing, sweating, or hot feelings Side effects that usually do not require medical attention (report to your doctor or health care professional if they continue or are bothersome): -changes in taste -constipation -dizziness -headache -nausea -pain, redness, or irritation at site where injected -vomiting This list may not describe all possible side effects. Call your doctor for medical advice about side effects. You may report side effects to FDA at 1-800-FDA-1088. Where should I keep my medicine? This drug is given in a hospital or clinic and will not be stored at home. NOTE: This sheet is a summary. It may not cover all possible information. If you have questions about this medicine, talk to your doctor, pharmacist, or health care provider.  2018 Elsevier/Gold Standard (2015-07-19 11:20:47)  

## 2018-05-20 ENCOUNTER — Inpatient Hospital Stay: Payer: BLUE CROSS/BLUE SHIELD | Admitting: Hematology

## 2018-05-20 ENCOUNTER — Other Ambulatory Visit: Payer: Self-pay | Admitting: *Deleted

## 2018-05-20 ENCOUNTER — Inpatient Hospital Stay: Payer: BLUE CROSS/BLUE SHIELD | Attending: Hematology

## 2018-05-20 DIAGNOSIS — D500 Iron deficiency anemia secondary to blood loss (chronic): Secondary | ICD-10-CM

## 2018-05-20 DIAGNOSIS — N920 Excessive and frequent menstruation with regular cycle: Secondary | ICD-10-CM | POA: Diagnosis not present

## 2018-05-20 LAB — CBC WITH DIFFERENTIAL (CANCER CENTER ONLY)
ABS IMMATURE GRANULOCYTES: 0.01 10*3/uL (ref 0.00–0.07)
BASOS PCT: 1 %
Basophils Absolute: 0.1 10*3/uL (ref 0.0–0.1)
Eosinophils Absolute: 0.2 10*3/uL (ref 0.0–0.5)
Eosinophils Relative: 5 %
HCT: 32.3 % — ABNORMAL LOW (ref 36.0–46.0)
Hemoglobin: 9.7 g/dL — ABNORMAL LOW (ref 12.0–15.0)
Immature Granulocytes: 0 %
Lymphocytes Relative: 32 %
Lymphs Abs: 1.4 10*3/uL (ref 0.7–4.0)
MCH: 24.5 pg — ABNORMAL LOW (ref 26.0–34.0)
MCHC: 30 g/dL (ref 30.0–36.0)
MCV: 81.6 fL (ref 80.0–100.0)
MONOS PCT: 9 %
Monocytes Absolute: 0.4 10*3/uL (ref 0.1–1.0)
NEUTROS ABS: 2.3 10*3/uL (ref 1.7–7.7)
Neutrophils Relative %: 53 %
PLATELETS: 242 10*3/uL (ref 150–400)
RBC: 3.96 MIL/uL (ref 3.87–5.11)
WBC Count: 4.4 10*3/uL (ref 4.0–10.5)
nRBC: 0 % (ref 0.0–0.2)

## 2018-05-20 LAB — RETICULOCYTES
IMMATURE RETIC FRACT: 13.8 % (ref 2.3–15.9)
RBC.: 3.96 MIL/uL (ref 3.87–5.11)
RETIC COUNT ABSOLUTE: 77.6 10*3/uL (ref 19.0–186.0)
Retic Ct Pct: 2 % (ref 0.4–3.1)

## 2018-05-20 LAB — VITAMIN B12: Vitamin B-12: 371 pg/mL (ref 180–914)

## 2018-05-20 LAB — CMP (CANCER CENTER ONLY)
ALT: 11 U/L (ref 0–44)
ANION GAP: 6 (ref 5–15)
AST: 13 U/L — ABNORMAL LOW (ref 15–41)
Albumin: 3.6 g/dL (ref 3.5–5.0)
Alkaline Phosphatase: 52 U/L (ref 38–126)
BILIRUBIN TOTAL: 0.2 mg/dL — AB (ref 0.3–1.2)
BUN: 7 mg/dL (ref 6–20)
CHLORIDE: 108 mmol/L (ref 98–111)
CO2: 26 mmol/L (ref 22–32)
Calcium: 8.2 mg/dL — ABNORMAL LOW (ref 8.9–10.3)
Creatinine: 0.77 mg/dL (ref 0.44–1.00)
Glucose, Bld: 91 mg/dL (ref 70–99)
POTASSIUM: 3.6 mmol/L (ref 3.5–5.1)
Sodium: 140 mmol/L (ref 135–145)
TOTAL PROTEIN: 6.6 g/dL (ref 6.5–8.1)

## 2018-05-20 LAB — SAMPLE TO BLOOD BANK

## 2018-05-20 LAB — IRON AND TIBC
Iron: 46 ug/dL (ref 41–142)
SATURATION RATIOS: 17 % — AB (ref 21–57)
TIBC: 270 ug/dL (ref 236–444)
UIBC: 224 ug/dL (ref 120–384)

## 2018-05-20 LAB — FERRITIN: FERRITIN: 100 ng/mL (ref 11–307)

## 2018-05-21 ENCOUNTER — Telehealth: Payer: Self-pay | Admitting: Hematology

## 2018-05-21 NOTE — Telephone Encounter (Signed)
Scheduled appt per 11/21 pt is aware of apt date and time

## 2018-06-10 ENCOUNTER — Inpatient Hospital Stay: Payer: BLUE CROSS/BLUE SHIELD | Admitting: Hematology

## 2018-06-15 NOTE — Progress Notes (Signed)
This encounter was created in error - please disregard.

## 2018-08-25 ENCOUNTER — Other Ambulatory Visit: Payer: Self-pay | Admitting: Cardiology

## 2018-08-25 ENCOUNTER — Other Ambulatory Visit: Payer: Self-pay | Admitting: Obstetrics and Gynecology

## 2018-08-25 DIAGNOSIS — R0989 Other specified symptoms and signs involving the circulatory and respiratory systems: Secondary | ICD-10-CM

## 2018-09-16 ENCOUNTER — Encounter (HOSPITAL_BASED_OUTPATIENT_CLINIC_OR_DEPARTMENT_OTHER): Payer: Self-pay

## 2018-09-16 ENCOUNTER — Ambulatory Visit (HOSPITAL_BASED_OUTPATIENT_CLINIC_OR_DEPARTMENT_OTHER): Admit: 2018-09-16 | Payer: BLUE CROSS/BLUE SHIELD | Admitting: Obstetrics and Gynecology

## 2018-09-16 SURGERY — DILATATION & CURETTAGE/HYSTEROSCOPY WITH MYOSURE
Anesthesia: Choice

## 2018-10-14 ENCOUNTER — Other Ambulatory Visit: Payer: Self-pay

## 2018-10-21 ENCOUNTER — Ambulatory Visit: Payer: BLUE CROSS/BLUE SHIELD | Admitting: Cardiology

## 2019-01-27 ENCOUNTER — Other Ambulatory Visit: Payer: BLUE CROSS/BLUE SHIELD

## 2019-01-27 ENCOUNTER — Telehealth: Payer: Self-pay | Admitting: Cardiology

## 2019-01-27 NOTE — Telephone Encounter (Signed)
Patient has new [employer] insurance specifying [facility] providers and we are NOT in the network.The carotid duplex and office visits have been cancelled.I spoke with the patient & she is aware.

## 2019-02-03 ENCOUNTER — Ambulatory Visit: Payer: BLUE CROSS/BLUE SHIELD | Admitting: Cardiology

## 2019-08-08 IMAGING — MG DIGITAL SCREENING BILATERAL MAMMOGRAM WITH CAD
6 series · 6 of 6 positions shown · non-contrast
Comparison: Previous exam(s).

CLINICAL DATA: Screening.

EXAM:
DIGITAL SCREENING BILATERAL MAMMOGRAM WITH CAD

[L MLO (1 of 2)]
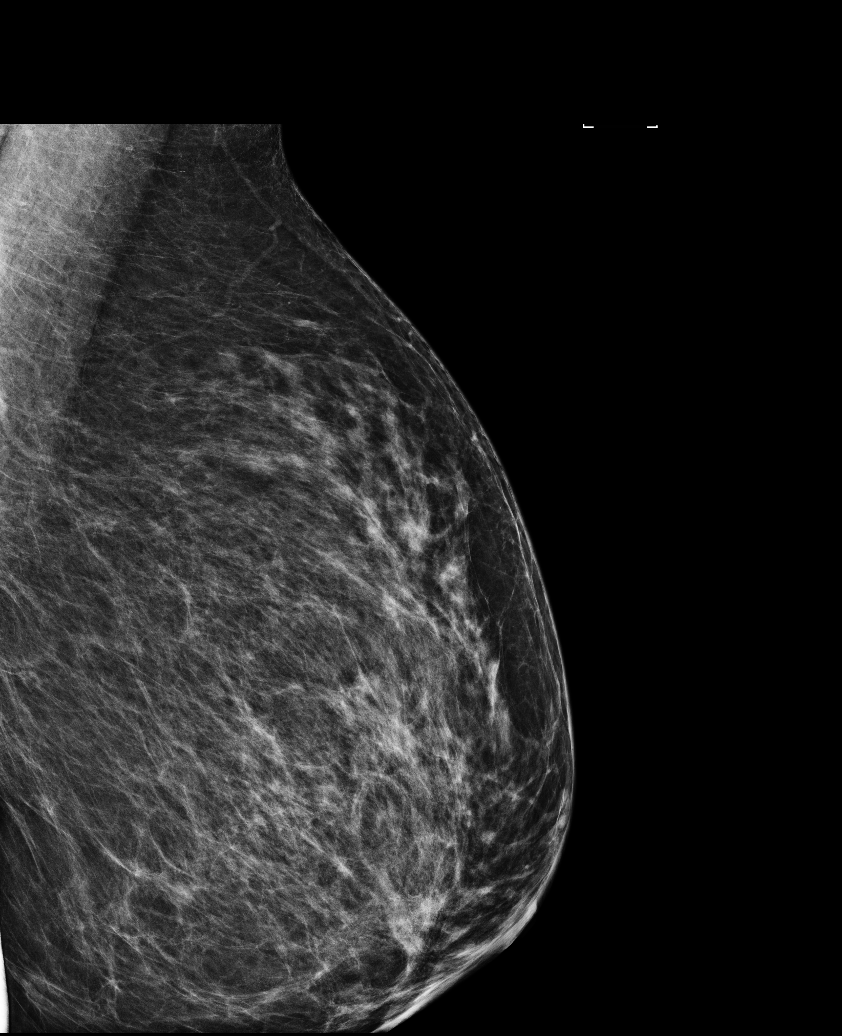

[L MLO (2 of 2)]
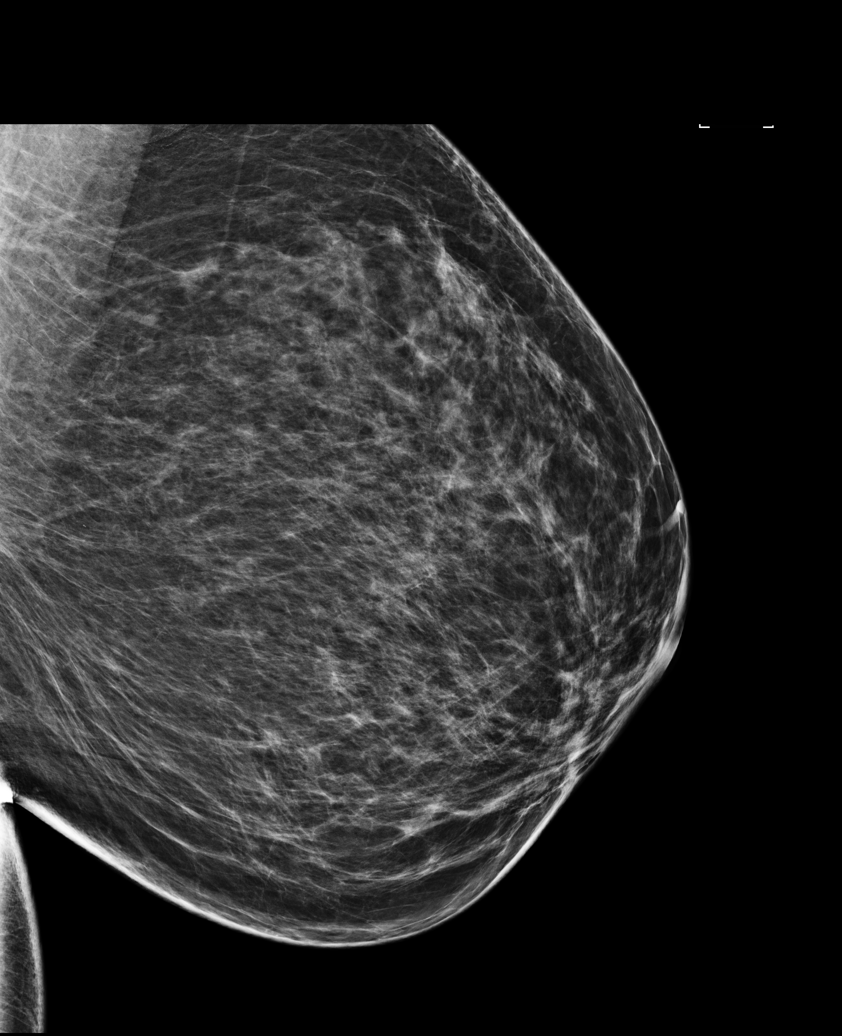

[R MLO (1 of 2)]
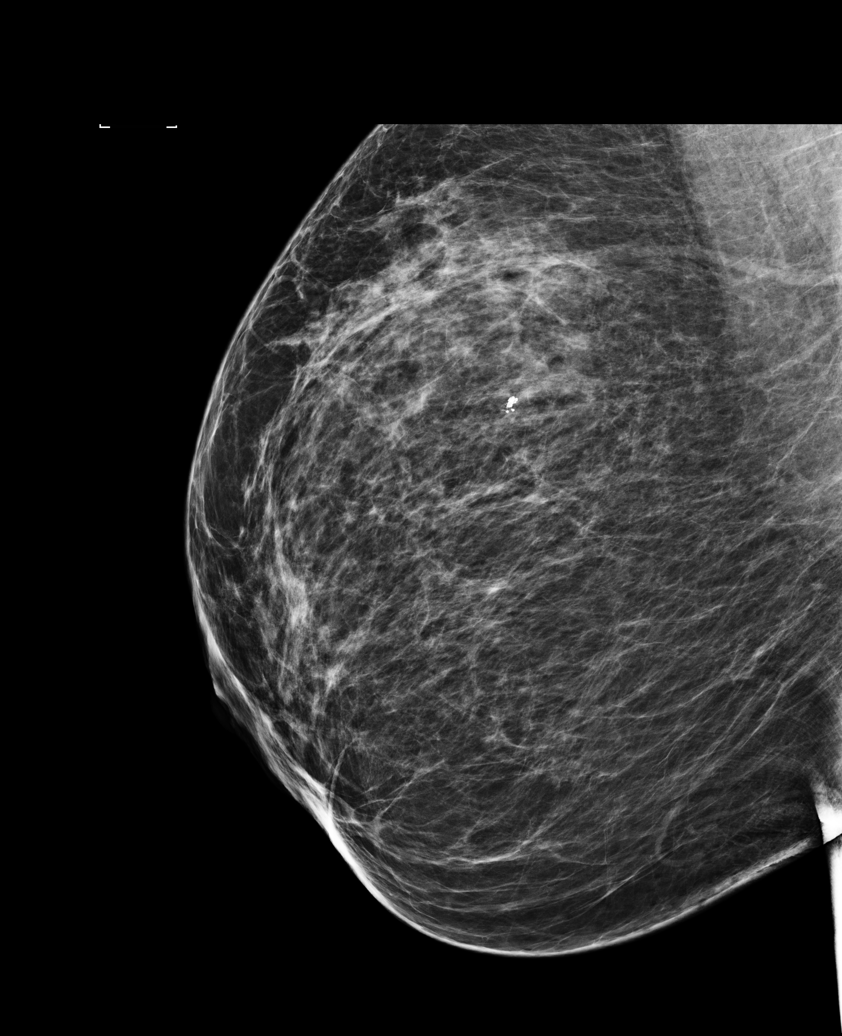

[R MLO (2 of 2)]
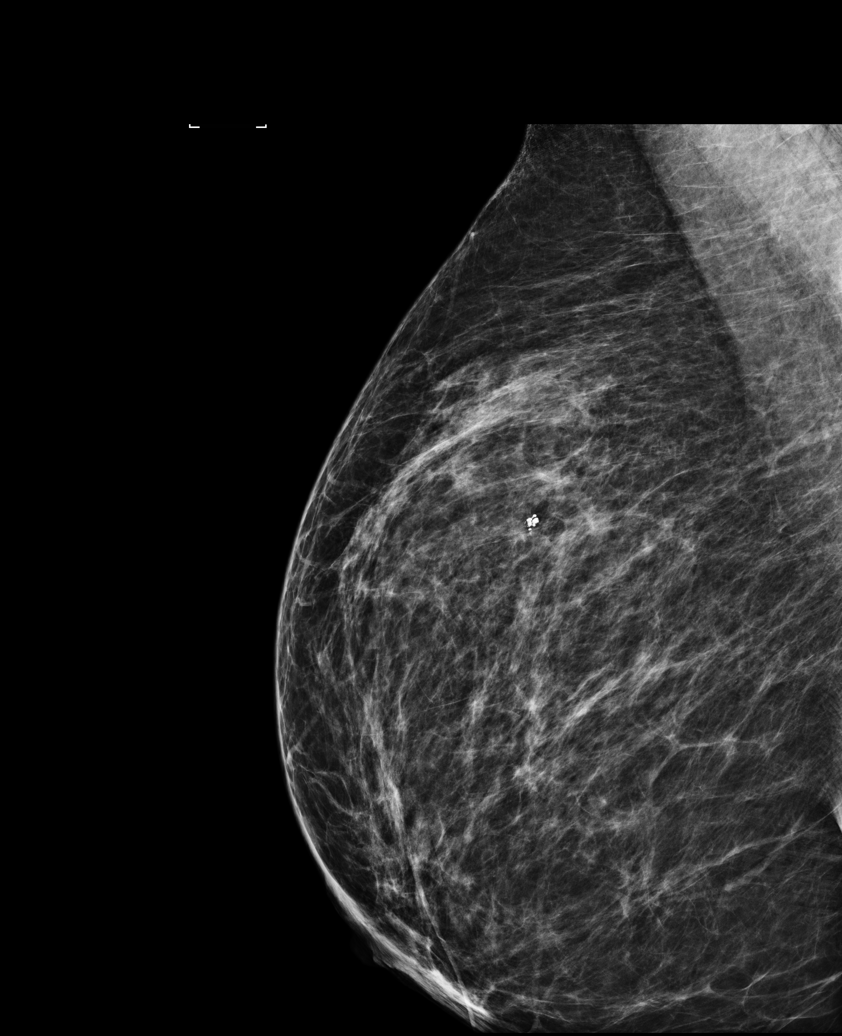

[L CC]
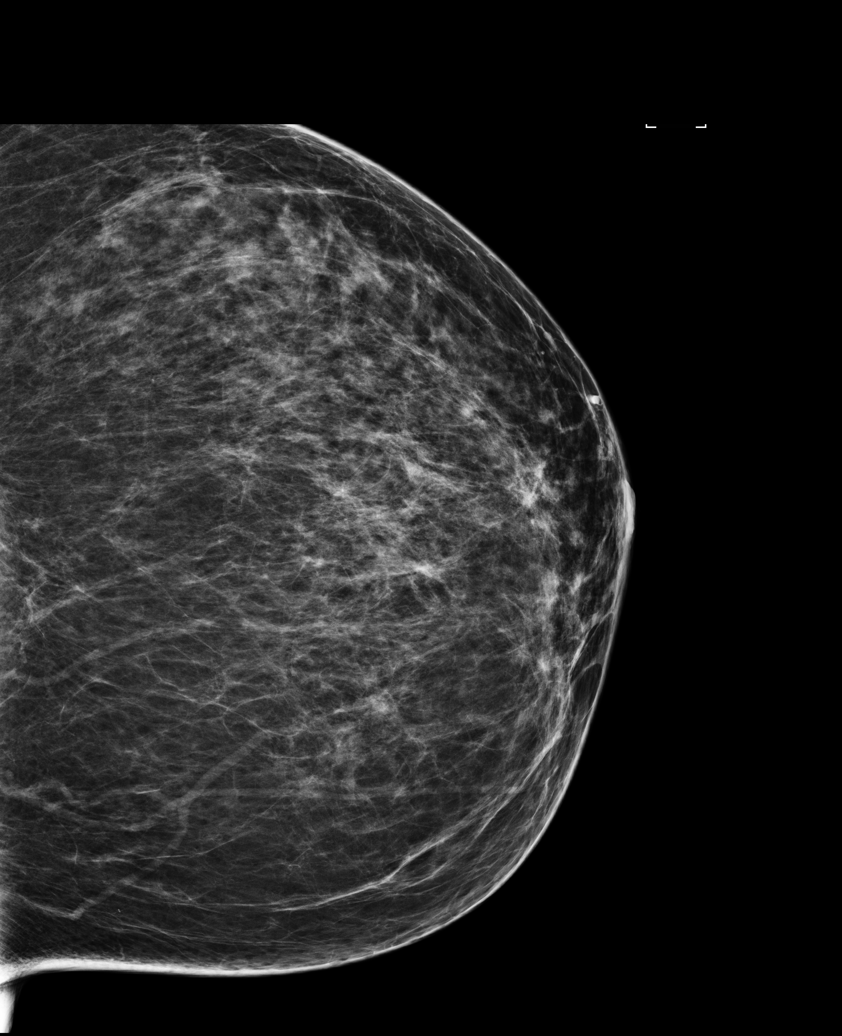

[R CC]
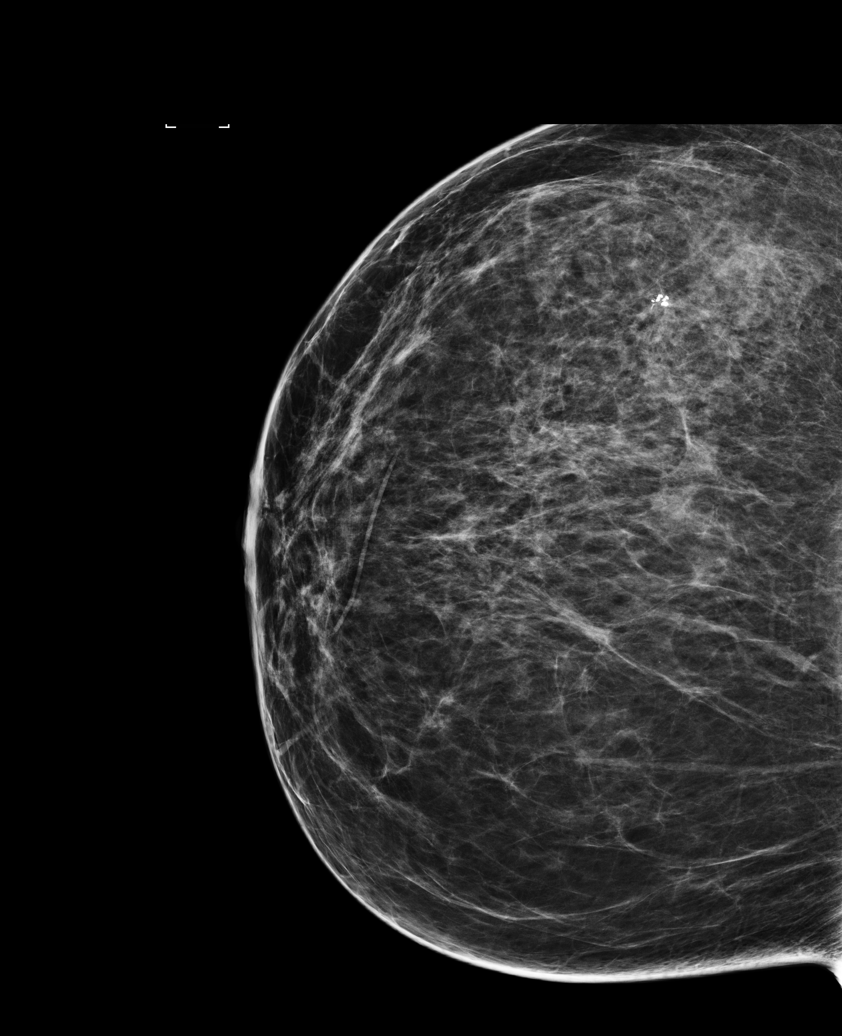

[6 of 6 positions shown; findings below may reference images not displayed]

ACR Breast Density Category b: There are scattered areas of
fibroglandular density.
FINDINGS: There are no findings suspicious for malignancy. Images were
processed with CAD.
IMPRESSION: No mammographic evidence of malignancy. A result letter of this
screening mammogram will be mailed directly to the patient.

RECOMMENDATION:
Screening mammogram in one year. (Code:AS-G-LCT)

BI-RADS CATEGORY  1: Negative.

## 2020-02-01 ENCOUNTER — Emergency Department (HOSPITAL_BASED_OUTPATIENT_CLINIC_OR_DEPARTMENT_OTHER)
Admission: EM | Admit: 2020-02-01 | Discharge: 2020-02-01 | Disposition: A | Discharge status: Home / Self Care | Payer: BLUE CROSS/BLUE SHIELD | Attending: Emergency Medicine | Admitting: Emergency Medicine

## 2020-02-01 ENCOUNTER — Emergency Department (HOSPITAL_BASED_OUTPATIENT_CLINIC_OR_DEPARTMENT_OTHER): Payer: BLUE CROSS/BLUE SHIELD

## 2020-02-01 ENCOUNTER — Encounter (HOSPITAL_BASED_OUTPATIENT_CLINIC_OR_DEPARTMENT_OTHER): Payer: Self-pay | Admitting: *Deleted

## 2020-02-01 ENCOUNTER — Other Ambulatory Visit: Payer: Self-pay

## 2020-02-01 DIAGNOSIS — Z79899 Other long term (current) drug therapy: Secondary | ICD-10-CM | POA: Insufficient documentation

## 2020-02-01 DIAGNOSIS — R0789 Other chest pain: Secondary | ICD-10-CM | POA: Diagnosis present

## 2020-02-01 DIAGNOSIS — R0602 Shortness of breath: Secondary | ICD-10-CM | POA: Diagnosis not present

## 2020-02-01 DIAGNOSIS — I10 Essential (primary) hypertension: Secondary | ICD-10-CM | POA: Insufficient documentation

## 2020-02-01 DIAGNOSIS — Z7982 Long term (current) use of aspirin: Secondary | ICD-10-CM | POA: Diagnosis not present

## 2020-02-01 LAB — CBC
HCT: 37.9 % (ref 36.0–46.0)
Hemoglobin: 11.6 g/dL — ABNORMAL LOW (ref 12.0–15.0)
MCH: 23.5 pg — ABNORMAL LOW (ref 26.0–34.0)
MCHC: 30.6 g/dL (ref 30.0–36.0)
MCV: 76.7 fL — ABNORMAL LOW (ref 80.0–100.0)
Platelets: 312 10*3/uL (ref 150–400)
RBC: 4.94 MIL/uL (ref 3.87–5.11)
RDW: 17.8 % — ABNORMAL HIGH (ref 11.5–15.5)
WBC: 8.7 10*3/uL (ref 4.0–10.5)
nRBC: 0 % (ref 0.0–0.2)

## 2020-02-01 LAB — BASIC METABOLIC PANEL
Anion gap: 10 (ref 5–15)
BUN: 5 mg/dL — ABNORMAL LOW (ref 6–20)
CO2: 28 mmol/L (ref 22–32)
Calcium: 8.4 mg/dL — ABNORMAL LOW (ref 8.9–10.3)
Chloride: 98 mmol/L (ref 98–111)
Creatinine, Ser: 0.85 mg/dL (ref 0.44–1.00)
GFR calc Af Amer: >60 mL/min (ref >60)
GFR calc non Af Amer: >60 mL/min (ref >60)
Glucose, Bld: 114 mg/dL — ABNORMAL HIGH (ref 70–99)
Potassium: 3.1 mmol/L — ABNORMAL LOW (ref 3.5–5.1)
Sodium: 136 mmol/L (ref 135–145)

## 2020-02-01 LAB — TROPONIN I (HIGH SENSITIVITY): Troponin I (High Sensitivity): <2 ng/L (ref <18)

## 2020-02-01 MED ORDER — KETOROLAC TROMETHAMINE 30 MG/ML IJ SOLN
30.0000 mg | Freq: Once | INTRAMUSCULAR | Status: AC
  Administered 2020-02-01: 30 mg via INTRAVENOUS
  Filled 2020-02-01: qty 1

## 2020-02-01 MED ORDER — POTASSIUM CHLORIDE CRYS ER 20 MEQ PO TBCR
40.0000 meq | EXTENDED_RELEASE_TABLET | Freq: Once | ORAL | Status: AC
  Administered 2020-02-01: 40 meq via ORAL
  Filled 2020-02-01: qty 2

## 2020-02-01 MED ORDER — IBUPROFEN 800 MG PO TABS: 800.0000 mg | ORAL_TABLET | Freq: Three times a day (TID) | ORAL | 0 refills | Status: AC | PRN

## 2020-02-01 NOTE — ED Provider Notes (Signed)
[facility] EMERGENCY DEPARTMENT Provider Note  CSN: [id number] Arrival date & time: 02/01/20 9485    History Chief Complaint  Patient presents with  . Chest Pain  . Shortness of Breath    HPI  Carrie [name] is a 44 y.o. female with history of HTN reports she was at work yesterday afternoon around 1pm, she was dusting but not around any chemicals, when she began to feel SOB. She had to sit down and drink some water but then she felt better. After she got home from work, around 6pm, she started having some sharp and aching midsternal chest pain, non radiating, not associated with nausea or diaphoresis. No prior history of CAD, no DM. Does not smoke. Mother had CAD at a young age. She denies any leg swelling. No recent fever cough, N/V/D. No recent travel.    Past Medical History:  Diagnosis Date  . Hypertension   . Thyroid disease     Past Surgical History:  Procedure Laterality Date  . BREAST CYST EXCISION Right     Family History  Problem Relation Age of Onset  . Heart disease Mother   . Heart disease Cousin     Social History   Tobacco Use  . Smoking status: Never Smoker  . Smokeless tobacco: Never Used  Vaping Use  . Vaping Use: Former  Substance Use Topics  . Alcohol use: Yes    Comment: rare - social  . Drug use: No     Home Medications Prior to Admission medications   Medication Sig Start Date End Date Taking? Authorizing Provider  aspirin 325 MG tablet Take 325 mg daily as needed by mouth for moderate pain.   Yes [provider]  cetirizine (ZYRTEC) 10 MG tablet Take 10 mg by mouth daily.   Yes [provider]  ferrous gluconate (FERGON) 324 MG tablet Take 1 tablet (324 mg total) by mouth 3 (three) times daily with meals. 02/05/17 02/01/20 Yes Perlov, Jene Every, MD  hydrochlorothiazide (HYDRODIURIL) 25 MG tablet Take 25 mg by mouth daily.   Yes [provider]  levothyroxine (SYNTHROID, LEVOTHROID) 100 MCG tablet Take  100 mcg daily before breakfast by mouth.    Yes [provider]  ibuprofen (ADVIL) 800 MG tablet Take 1 tablet (800 mg total) by mouth every 8 (eight) hours as needed. 02/01/20   Pollyann Savoy, MD     Allergies    Patient has no known allergies.   Review of Systems   Review of Systems A comprehensive review of systems was completed and negative except as noted in HPI.    Physical Exam BP (!) 132/91 (BP Location: Left Arm)   Pulse 77   Temp 98.2 F (36.8 C) (Oral)   Resp (!) 22   Ht 5\' 3"  (1.6 m)   Wt 109.8 kg   LMP 01/03/2020   SpO2 100%   BMI 42.87 kg/m   Physical Exam Vitals and nursing note reviewed.  Constitutional:      Appearance: Normal appearance.  HENT:     Head: Normocephalic and atraumatic.     Nose: Nose normal.     Mouth/Throat:     Mouth: Mucous membranes are moist.  Eyes:     Extraocular Movements: Extraocular movements intact.     Conjunctiva/sclera: Conjunctivae normal.  Cardiovascular:     Rate and Rhythm: Normal rate.  Pulmonary:     Effort: Pulmonary effort is normal.     Breath sounds: Normal breath  sounds.  Chest:     Chest wall: Tenderness (reproduces pain) present.  Abdominal:     General: Abdomen is flat.     Palpations: Abdomen is soft.     Tenderness: There is no abdominal tenderness.  Musculoskeletal:        General: No swelling. Normal range of motion.     Cervical back: Neck supple.  Skin:    General: Skin is warm and dry.  Neurological:     General: No focal deficit present.     Mental Status: She is alert.  Psychiatric:        Mood and Affect: Mood normal.      ED Results / Procedures / Treatments   Labs (all labs ordered are listed, but only abnormal results are displayed) Labs Reviewed  BASIC METABOLIC PANEL - Abnormal; Notable for the following components:      Result Value   Potassium 3.1 (*)    Glucose, Bld 114 (*)    BUN 5 (*)    Calcium 8.4 (*)    All other components within normal limits    CBC - Abnormal; Notable for the following components:   Hemoglobin 11.6 (*)    MCV 76.7 (*)    MCH 23.5 (*)    RDW 17.8 (*)    All other components within normal limits  TROPONIN I (HIGH SENSITIVITY)  TROPONIN I (HIGH SENSITIVITY)    EKG EKG Interpretation  Date/Time:  Wednesday February 01 2020 09:31:03 EDT Ventricular Rate:  94 PR Interval:  160 QRS Duration: 90 QT Interval:  376 QTC Calculation: 470 R Axis:   36 Text Interpretation: Normal sinus rhythm Normal ECG No significant change since last tracing Confirmed by Susy Frizzle [phone]) on 02/01/2020 9:35:42 AM    Radiology DG Chest 2 View  Result Date: 02/01/2020 CLINICAL DATA:  Chest pain EXAM: CHEST - 2 VIEW COMPARISON:  July 21, 2011 FINDINGS: The cardiomediastinal silhouette is unchanged in contour. No pleural effusion. No pneumothorax. No acute pleuroparenchymal abnormality. Status post cholecystectomy. No acute osseous abnormality. IMPRESSION: No acute cardiopulmonary abnormality. Electronically Signed   By: Meda Klinefelter MD   On: 02/01/2020 11:03    Procedures Procedures  Medications Ordered in the ED Medications  ketorolac (TORADOL) 30 MG/ML injection 30 mg (30 mg Intravenous Given 02/01/20 1129)  potassium chloride SA (KLOR-CON) CR tablet 40 mEq (40 mEq Oral Given 02/01/20 1159)     MDM Rules/Calculators/A&P MDM Patient with atypical chest pain, >12 hours since onset and reproducible. EKG normal. Will check CBC, BMP, Trop and CXR. She has had covid vaccine.  ED Course  I have reviewed the triage vital signs and the nursing notes.  Pertinent labs & imaging results that were available during my care of the patient were reviewed by me and considered in my medical decision making (see chart for details).  Clinical Course as of Jan 31 1241  Wed Feb 01, 2020  1118 CBC with mild anemia, otherwise normal. CXR without acute process.    [CS]  1147 BMP with mild hypokalemia. Will give a dose of PO K.     [CS]  1225 Trop neg. Given symptom duration, one trop is sufficient to rule out ACS. Pain improved with Toradol, suspect MSK pain. Rx for Motrin, rest, and PCP followup. RTED for any other concerns.    [CS]    Clinical Course User Index [CS] Pollyann Savoy, MD    Final Clinical Impression(s) / ED Diagnoses Final diagnoses:  Atypical chest  pain    Rx / DC Orders ED Discharge Orders         Ordered    ibuprofen (ADVIL) 800 MG tablet  Every 8 hours PRN     Discontinue  Reprint     02/01/20 1227           Pollyann Savoy, MD 02/01/20 1242

## 2020-02-01 NOTE — ED Notes (Signed)
Pt on monitor 

## 2020-02-01 NOTE — ED Notes (Signed)
Patient transported to X-ray 

## 2020-02-01 NOTE — ED Triage Notes (Signed)
Shortness of breath started yesterday at work.  Chest pain started last night.

## 2021-04-16 ENCOUNTER — Encounter: Payer: Self-pay | Admitting: Hematology

## 2021-04-29 ENCOUNTER — Encounter: Payer: Self-pay | Admitting: Urology

## 2021-04-29 ENCOUNTER — Ambulatory Visit: Payer: Self-pay | Admitting: Urology

## 2021-10-27 IMAGING — CR DG CHEST 2V
2 series · 2 of 2 positions shown · non-contrast
Comparison: July 21, 2011

CLINICAL DATA: Chest pain

EXAM:
CHEST - 2 VIEW

[w chest pa *]
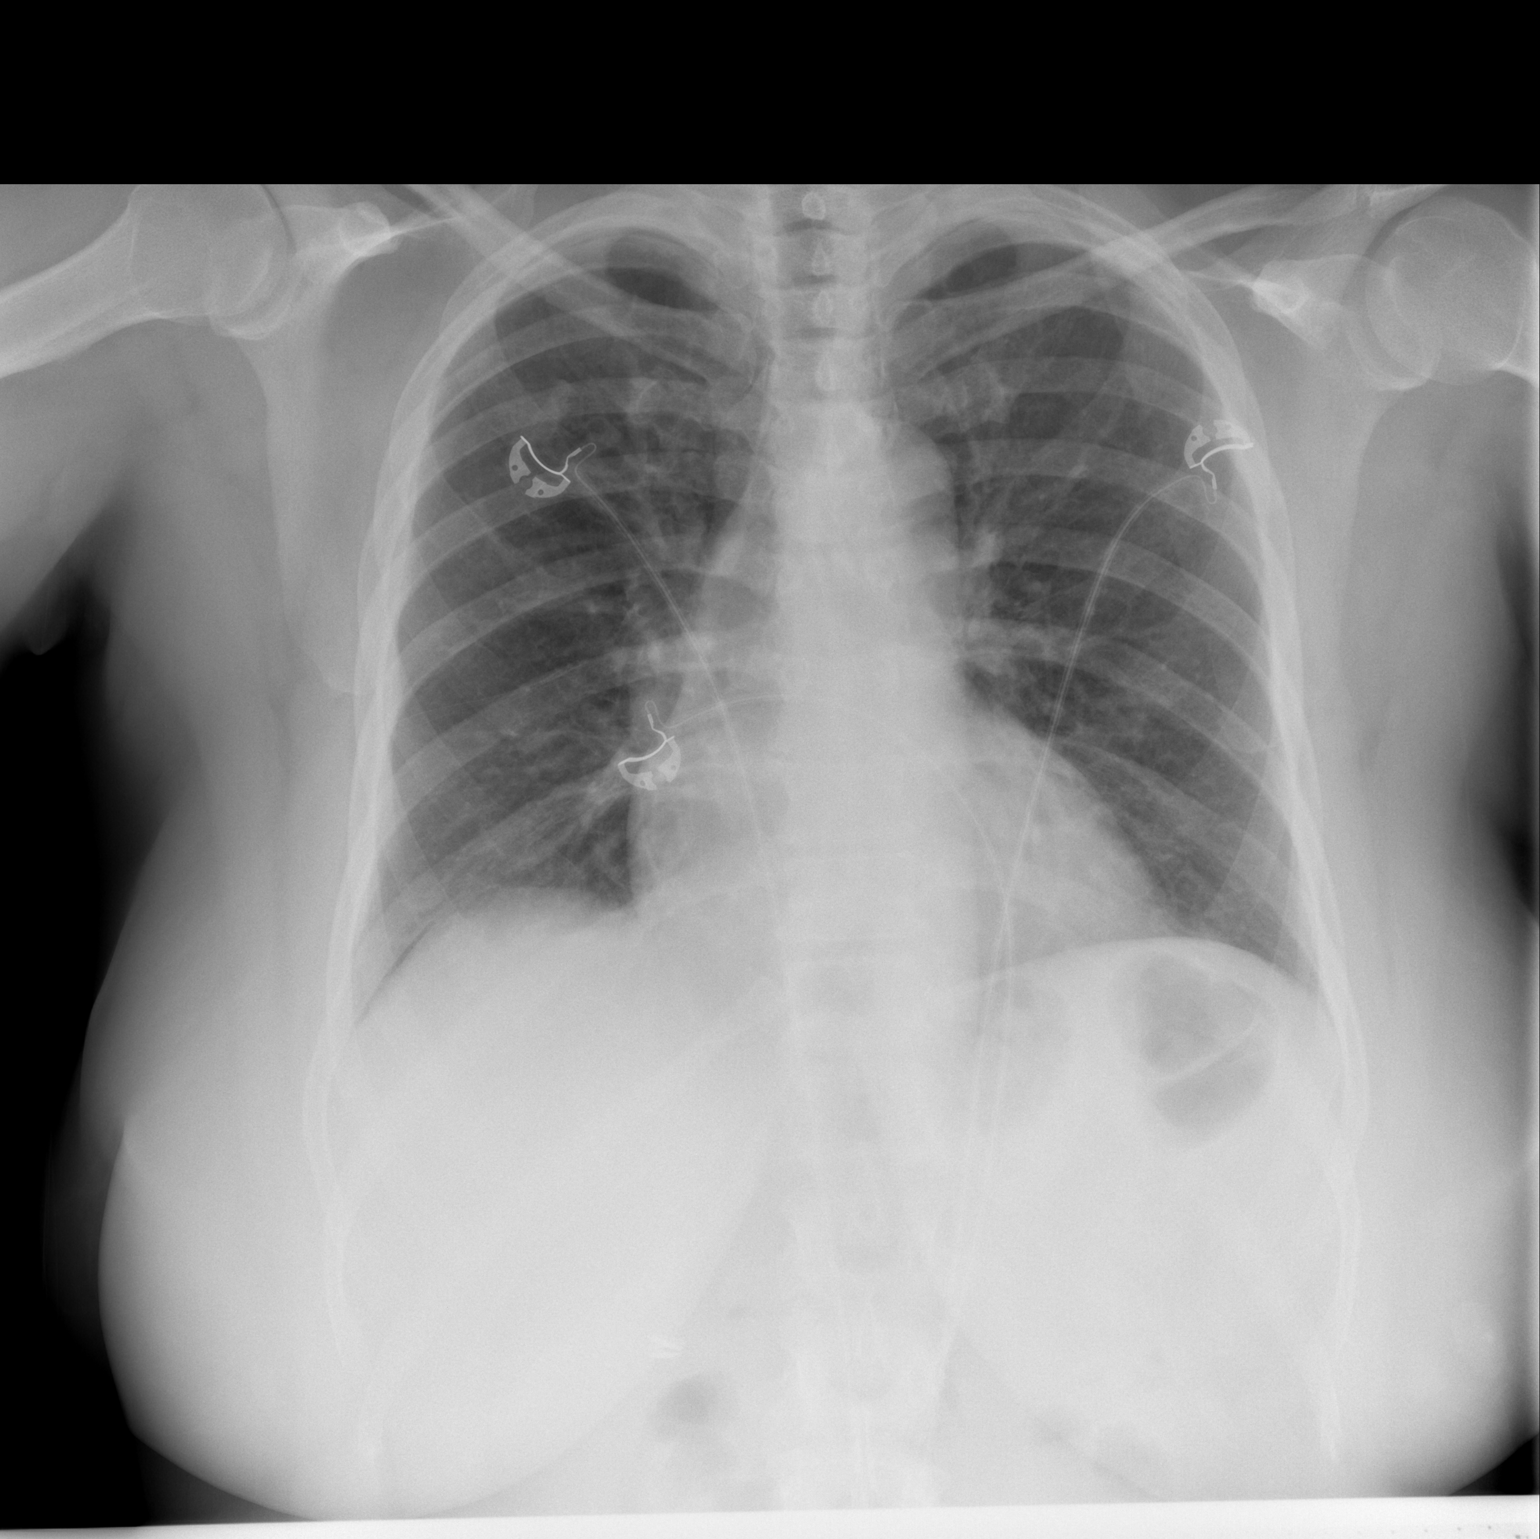

[w chest lat *]
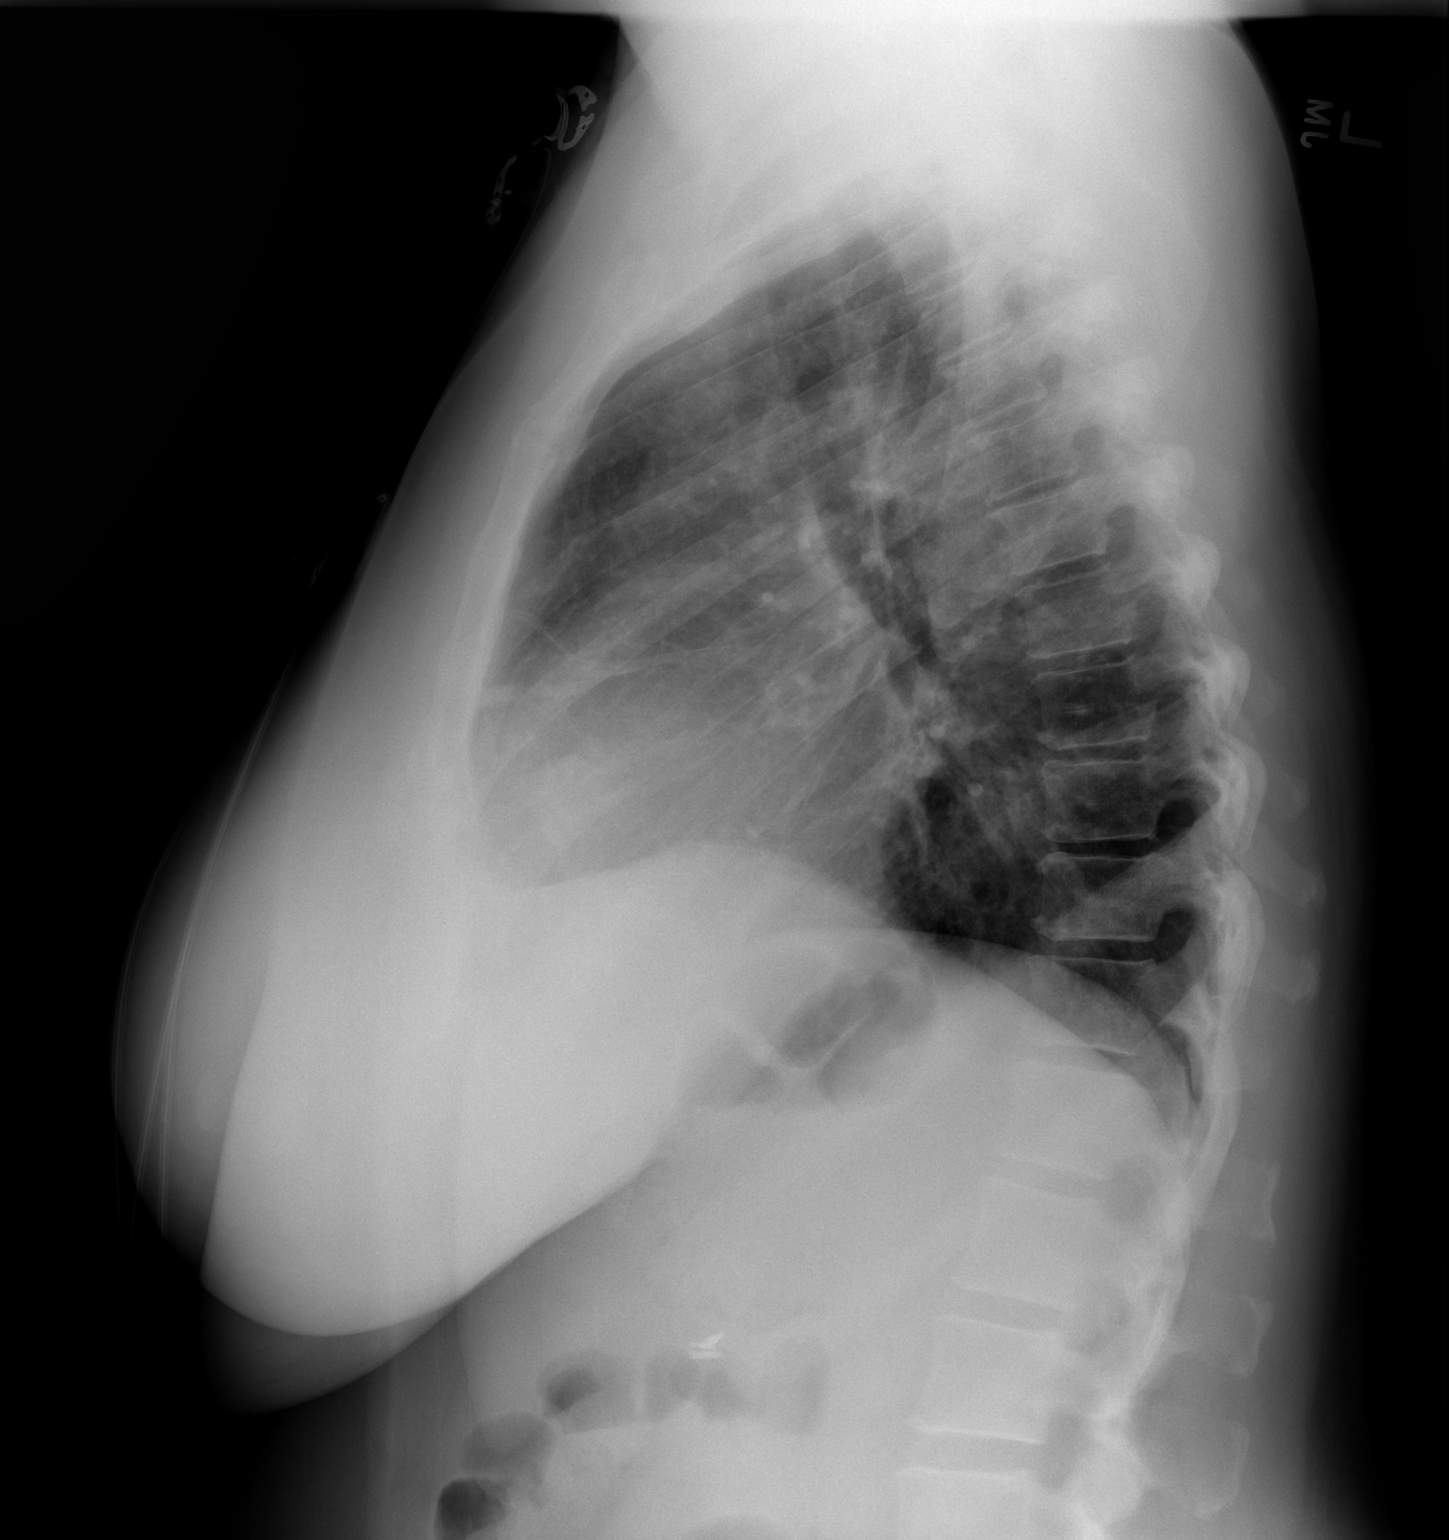

[2 of 2 positions shown; findings below may reference images not displayed]

FINDINGS: The cardiomediastinal silhouette is unchanged in contour. No pleural
effusion. No pneumothorax. No acute pleuroparenchymal abnormality.
Status post cholecystectomy. No acute osseous abnormality.
IMPRESSION: No acute cardiopulmonary abnormality.

## 2024-07-06 NOTE — Progress Notes (Signed)
 Pt attended a screening event at [facility] in [place]. Pt was advised to contact her PCP about her BP. Pt was also given recommendations to manage her bp.
